# Patient Record
Sex: Female | Born: 1955 | Race: White | Hispanic: No | Marital: Married | Smoking: Never smoker
Health system: Southern US, Community
[De-identification: ages and names within clinical notes are randomized; demographics above are authoritative.]

## PROBLEM LIST (undated history)

## (undated) DIAGNOSIS — Z87442 Personal history of urinary calculi: Secondary | ICD-10-CM

## (undated) DIAGNOSIS — G44009 Cluster headache syndrome, unspecified, not intractable: Secondary | ICD-10-CM

## (undated) DIAGNOSIS — R112 Nausea with vomiting, unspecified: Secondary | ICD-10-CM

## (undated) DIAGNOSIS — IMO0001 Reserved for inherently not codable concepts without codable children: Secondary | ICD-10-CM

## (undated) DIAGNOSIS — K219 Gastro-esophageal reflux disease without esophagitis: Secondary | ICD-10-CM

## (undated) DIAGNOSIS — E119 Type 2 diabetes mellitus without complications: Secondary | ICD-10-CM

## (undated) DIAGNOSIS — S838X9A Sprain of other specified parts of unspecified knee, initial encounter: Secondary | ICD-10-CM

## (undated) DIAGNOSIS — I499 Cardiac arrhythmia, unspecified: Secondary | ICD-10-CM

## (undated) DIAGNOSIS — E785 Hyperlipidemia, unspecified: Secondary | ICD-10-CM

## (undated) DIAGNOSIS — J40 Bronchitis, not specified as acute or chronic: Secondary | ICD-10-CM

## (undated) DIAGNOSIS — M1710 Unilateral primary osteoarthritis, unspecified knee: Secondary | ICD-10-CM

## (undated) DIAGNOSIS — N200 Calculus of kidney: Secondary | ICD-10-CM

## (undated) DIAGNOSIS — M549 Dorsalgia, unspecified: Secondary | ICD-10-CM

## (undated) DIAGNOSIS — R002 Palpitations: Secondary | ICD-10-CM

## (undated) DIAGNOSIS — E039 Hypothyroidism, unspecified: Secondary | ICD-10-CM

## (undated) DIAGNOSIS — F419 Anxiety disorder, unspecified: Secondary | ICD-10-CM

## (undated) DIAGNOSIS — Z9889 Other specified postprocedural states: Secondary | ICD-10-CM

## (undated) DIAGNOSIS — Z8719 Personal history of other diseases of the digestive system: Secondary | ICD-10-CM

## (undated) DIAGNOSIS — Z9689 Presence of other specified functional implants: Secondary | ICD-10-CM

## (undated) DIAGNOSIS — R7303 Prediabetes: Secondary | ICD-10-CM

## (undated) DIAGNOSIS — M47817 Spondylosis without myelopathy or radiculopathy, lumbosacral region: Secondary | ICD-10-CM

## (undated) DIAGNOSIS — G8929 Other chronic pain: Secondary | ICD-10-CM

## (undated) HISTORY — PX: KIDNEY STONE SURGERY: SHX686

## (undated) HISTORY — PX: CYSTO: SHX6284

## (undated) HISTORY — PX: CHOLECYSTECTOMY: SHX55

---

## 1977-09-27 HISTORY — PX: TOTAL ABDOMINAL HYSTERECTOMY W/ BILATERAL SALPINGOOPHORECTOMY: SHX83

## 1988-09-27 HISTORY — PX: OTHER SURGICAL HISTORY: SHX169

## 2008-09-27 HISTORY — PX: SPINAL CORD STIMULATOR IMPLANT: SHX2422

## 2011-11-16 ENCOUNTER — Other Ambulatory Visit: Payer: Self-pay | Admitting: Orthopedic Surgery

## 2011-12-15 ENCOUNTER — Encounter (HOSPITAL_BASED_OUTPATIENT_CLINIC_OR_DEPARTMENT_OTHER): Payer: Self-pay | Admitting: *Deleted

## 2011-12-15 NOTE — Progress Notes (Signed)
NPO AFTER MN. ARRIVES AT 0745. NEEDS ISTAT AND EKG. WILL TAKE NORVASC, METOPROLOL, SYNTHROID, AND XANAX AM OF SURG. W/ SIP OF WATER.

## 2011-12-21 NOTE — H&P (Signed)
CC- [name] is a 56 y.o. female who presents with right knee pain.  HPI- . Knee Pain: Patient presents with knee pain involving the  right knee. Onset of the symptoms was several months ago. Inciting event: injured while walking dog. Current symptoms include giving out, pain located medially and swelling. Pain is aggravated by kneeling, rising after sitting and squatting.  Patient has had no prior knee problems. Evaluation to date: plain films: normal. Treatment to date: brace which is not very effective.  Past Medical History  Diagnosis Date  . Acute medial meniscal injury of knee right  . Borderline diabetes   . Hypertension   . Hypothyroidism   . GERD (gastroesophageal reflux disease)   . H/O hiatal hernia   . Facet joint disease of lumbosacral region   . Arthritis of knee bilateral   . Cluster headaches   . Spinal cord stimulator status placement in 2010  . Chronic back pain   . Rapid palpitations controlled w/ metoprolol  . Normal cardiac stress test 5 yrs ago  . Normal echocardiogram 5 yrs ago  . PONV (postoperative nausea and vomiting)     Past Surgical History  Procedure Date  . Spinal cord stimulator implant 2010  . Total abdominal hysterectomy w/ bilateral salpingoophorectomy 1979  . Benign cyst removed left breast 1990    Prior to Admission medications   Medication Sig Start Date End Date Taking? Authorizing Provider  ALPRAZolam Prudy Feeler) 0.5 MG tablet Take 0.5 mg by mouth 2 (two) times daily.   Yes Historical Provider, MD  amLODipine (NORVASC) 2.5 MG tablet Take 2.5 mg by mouth daily. Pt takes this med. For cluster headache's   Yes Historical Provider, MD  esomeprazole (NEXIUM) 40 MG capsule Take 40 mg by mouth daily before breakfast.   Yes Historical Provider, MD  glimepiride (AMARYL) 2 MG tablet Take 2 mg by mouth daily before breakfast.   Yes Historical Provider, MD  levothyroxine (SYNTHROID, LEVOTHROID) 50 MCG tablet Take 50 mcg by mouth every morning.   Yes  Historical Provider, MD  lidocaine (LIDODERM) 5 % Place 1 patch onto the skin daily. Remove & Discard patch within 12 hours or as directed by MD   Yes Historical Provider, MD  meloxicam (MOBIC) 15 MG tablet Take 15 mg by mouth daily.   Yes Historical Provider, MD  metFORMIN (GLUCOPHAGE) 1000 MG tablet Take 1,000 mg by mouth 2 (two) times daily with a meal.   Yes Historical Provider, MD  metoprolol (LOPRESSOR) 50 MG tablet Take 50 mg by mouth 2 (two) times daily. Pt takes this med. For palpitations/ tachycardia   Yes Historical Provider, MD  NORTRIPTYLINE HCL PO Take 100 mg by mouth at bedtime.   Yes Historical Provider, MD  simvastatin (ZOCOR) 40 MG tablet Take 40 mg by mouth every evening.   Yes Historical Provider, MD  traMADol (ULTRAM) 50 MG tablet Take 50 mg by mouth every 6 (six) hours as needed.   Yes Historical Provider, MD  Vitamin D, Ergocalciferol, (DRISDOL) 50000 UNITS CAPS Take 50,000 Units by mouth 2 (two) times a week.   Yes Historical Provider, MD   KNEE EXAM antalgic gait, soft tissue tenderness over medial joint line, reduced range of motion, collateral ligaments intact, normal ipsilateral hip exam  Physical Examination: General appearance - alert, well appearing, and in no distress Mental status - alert, oriented to person, place, and time Chest - clear to auscultation, no wheezes, rales or rhonchi, symmetric air entry Heart - normal rate,  regular rhythm, normal S1, S2, no murmurs, rubs, clicks or gallops Abdomen - soft, nontender, nondistended, no masses or organomegaly Neurological - alert, oriented, normal speech, no focal findings or movement disorder noted   Asessment/Plan---Right knee medial meniscal tear- - Plan right knee arthroscopy with meniscal debridement. Procedure risks and potential comps discussed with patient who elects to proceed. Goals are decreased pain and increased function with a high likelihood of achieving both

## 2011-12-22 ENCOUNTER — Ambulatory Visit (HOSPITAL_BASED_OUTPATIENT_CLINIC_OR_DEPARTMENT_OTHER): Payer: BC Managed Care – PPO | Admitting: Anesthesiology

## 2011-12-22 ENCOUNTER — Encounter (HOSPITAL_BASED_OUTPATIENT_CLINIC_OR_DEPARTMENT_OTHER)
Admission: RE | Disposition: A | Discharge status: Home / Self Care | Payer: Self-pay | Source: Ambulatory Visit | Attending: Orthopedic Surgery

## 2011-12-22 ENCOUNTER — Other Ambulatory Visit: Payer: Self-pay

## 2011-12-22 ENCOUNTER — Encounter (HOSPITAL_BASED_OUTPATIENT_CLINIC_OR_DEPARTMENT_OTHER): Payer: Self-pay | Admitting: *Deleted

## 2011-12-22 ENCOUNTER — Ambulatory Visit (HOSPITAL_BASED_OUTPATIENT_CLINIC_OR_DEPARTMENT_OTHER)
Admission: RE | Admit: 2011-12-22 | Discharge: 2011-12-22 | Disposition: A | Discharge status: Home / Self Care | Payer: BC Managed Care – PPO | Source: Ambulatory Visit | Attending: Orthopedic Surgery | Admitting: Orthopedic Surgery

## 2011-12-22 ENCOUNTER — Encounter (HOSPITAL_BASED_OUTPATIENT_CLINIC_OR_DEPARTMENT_OTHER): Payer: Self-pay | Admitting: Anesthesiology

## 2011-12-22 DIAGNOSIS — Y93K1 Activity, walking an animal: Secondary | ICD-10-CM | POA: Insufficient documentation

## 2011-12-22 DIAGNOSIS — S83249A Other tear of medial meniscus, current injury, unspecified knee, initial encounter: Secondary | ICD-10-CM | POA: Diagnosis present

## 2011-12-22 DIAGNOSIS — K219 Gastro-esophageal reflux disease without esophagitis: Secondary | ICD-10-CM | POA: Insufficient documentation

## 2011-12-22 DIAGNOSIS — IMO0002 Reserved for concepts with insufficient information to code with codable children: Secondary | ICD-10-CM | POA: Insufficient documentation

## 2011-12-22 DIAGNOSIS — E039 Hypothyroidism, unspecified: Secondary | ICD-10-CM | POA: Insufficient documentation

## 2011-12-22 DIAGNOSIS — I10 Essential (primary) hypertension: Secondary | ICD-10-CM | POA: Insufficient documentation

## 2011-12-22 DIAGNOSIS — Z79899 Other long term (current) drug therapy: Secondary | ICD-10-CM | POA: Insufficient documentation

## 2011-12-22 DIAGNOSIS — X58XXXA Exposure to other specified factors, initial encounter: Secondary | ICD-10-CM | POA: Insufficient documentation

## 2011-12-22 DIAGNOSIS — R7309 Other abnormal glucose: Secondary | ICD-10-CM | POA: Insufficient documentation

## 2011-12-22 HISTORY — DX: Palpitations: R00.2

## 2011-12-22 HISTORY — DX: Sprain of other specified parts of unspecified knee, initial encounter: S83.8X9A

## 2011-12-22 HISTORY — DX: Prediabetes: R73.03

## 2011-12-22 HISTORY — DX: Personal history of other diseases of the digestive system: Z87.19

## 2011-12-22 HISTORY — DX: Reserved for inherently not codable concepts without codable children: IMO0001

## 2011-12-22 HISTORY — DX: Nausea with vomiting, unspecified: Z98.890

## 2011-12-22 HISTORY — DX: Dorsalgia, unspecified: M54.9

## 2011-12-22 HISTORY — DX: Unilateral primary osteoarthritis, unspecified knee: M17.10

## 2011-12-22 HISTORY — PX: KNEE ARTHROSCOPY: SHX127

## 2011-12-22 HISTORY — DX: Other chronic pain: G89.29

## 2011-12-22 HISTORY — DX: Nausea with vomiting, unspecified: R11.2

## 2011-12-22 HISTORY — DX: Cluster headache syndrome, unspecified, not intractable: G44.009

## 2011-12-22 HISTORY — DX: Spondylosis without myelopathy or radiculopathy, lumbosacral region: M47.817

## 2011-12-22 HISTORY — DX: Gastro-esophageal reflux disease without esophagitis: K21.9

## 2011-12-22 HISTORY — DX: Hypothyroidism, unspecified: E03.9

## 2011-12-22 HISTORY — DX: Presence of other specified functional implants: Z96.89

## 2011-12-22 LAB — GLUCOSE, CAPILLARY: Glucose-Capillary: 136 mg/dL — ABNORMAL HIGH (ref 70–99)

## 2011-12-22 LAB — POCT I-STAT 4, (NA,K, GLUC, HGB,HCT)
Glucose, Bld: 126 mg/dL — ABNORMAL HIGH (ref 70–99)
HCT: 35 % — ABNORMAL LOW (ref 36.0–46.0)
Hemoglobin: 11.9 g/dL — ABNORMAL LOW (ref 12.0–15.0)
Potassium: 3.7 mEq/L (ref 3.5–5.1)

## 2011-12-22 SURGERY — ARTHROSCOPY, KNEE
Anesthesia: General | Site: Knee | Laterality: Right | Wound class: Clean

## 2011-12-22 MED ORDER — HYDROMORPHONE HCL 2 MG PO TABS
2.0000 mg | ORAL_TABLET | Freq: Four times a day (QID) | ORAL | Status: DC | PRN
  Administered 2011-12-22: 2 mg via ORAL

## 2011-12-22 MED ORDER — METHOCARBAMOL 500 MG PO TABS: 500.0000 mg | ORAL_TABLET | Freq: Four times a day (QID) | ORAL | Status: AC

## 2011-12-22 MED ORDER — DEXAMETHASONE SODIUM PHOSPHATE 10 MG/ML IJ SOLN: 10.0000 mg | Freq: Once | INTRAMUSCULAR | Status: DC

## 2011-12-22 MED ORDER — PROMETHAZINE HCL 25 MG/ML IJ SOLN: 6.2500 mg | INTRAMUSCULAR | Status: DC | PRN

## 2011-12-22 MED ORDER — SODIUM CHLORIDE 0.9 % IV SOLN: INTRAVENOUS | Status: DC

## 2011-12-22 MED ORDER — DEXAMETHASONE SODIUM PHOSPHATE 4 MG/ML IJ SOLN
INTRAMUSCULAR | Status: DC | PRN
  Administered 2011-12-22: 10 mg via INTRAVENOUS

## 2011-12-22 MED ORDER — FENTANYL CITRATE 0.05 MG/ML IJ SOLN
INTRAMUSCULAR | Status: DC | PRN
  Administered 2011-12-22 (×2): 50 ug via INTRAVENOUS

## 2011-12-22 MED ORDER — KETOROLAC TROMETHAMINE 30 MG/ML IJ SOLN
INTRAMUSCULAR | Status: DC | PRN
  Administered 2011-12-22: 30 mg via INTRAVENOUS

## 2011-12-22 MED ORDER — LACTATED RINGERS IV SOLN
INTRAVENOUS | Status: DC
  Administered 2011-12-22: 100 mL/h via INTRAVENOUS

## 2011-12-22 MED ORDER — FENTANYL CITRATE 0.05 MG/ML IJ SOLN
25.0000 ug | INTRAMUSCULAR | Status: DC | PRN
  Administered 2011-12-22: 25 ug via INTRAVENOUS

## 2011-12-22 MED ORDER — MIDAZOLAM HCL 5 MG/5ML IJ SOLN
INTRAMUSCULAR | Status: DC | PRN
  Administered 2011-12-22: 2 mg via INTRAVENOUS

## 2011-12-22 MED ORDER — PROPOFOL 10 MG/ML IV EMUL
INTRAVENOUS | Status: DC | PRN
  Administered 2011-12-22: 150 mg via INTRAVENOUS

## 2011-12-22 MED ORDER — HYDROMORPHONE HCL 2 MG PO TABS: 2.0000 mg | ORAL_TABLET | ORAL | Status: AC | PRN

## 2011-12-22 MED ORDER — ONDANSETRON HCL 4 MG PO TABS: 4.0000 mg | ORAL_TABLET | Freq: Three times a day (TID) | ORAL | Status: AC | PRN

## 2011-12-22 MED ORDER — ONDANSETRON HCL 4 MG/2ML IJ SOLN
INTRAMUSCULAR | Status: DC | PRN
  Administered 2011-12-22: 4 mg via INTRAVENOUS

## 2011-12-22 MED ORDER — SODIUM CHLORIDE 0.9 % IR SOLN
Status: DC | PRN
  Administered 2011-12-22: 9000 mL

## 2011-12-22 MED ORDER — BUPIVACAINE-EPINEPHRINE 0.25% -1:200000 IJ SOLN
INTRAMUSCULAR | Status: DC | PRN
  Administered 2011-12-22: 20 mL

## 2011-12-22 MED ORDER — METHOCARBAMOL 500 MG PO TABS
500.0000 mg | ORAL_TABLET | Freq: Three times a day (TID) | ORAL | Status: DC
  Administered 2011-12-22: 500 mg via ORAL

## 2011-12-22 MED ORDER — ACETAMINOPHEN 10 MG/ML IV SOLN
1000.0000 mg | Freq: Once | INTRAVENOUS | Status: AC
  Administered 2011-12-22: 1000 mg via INTRAVENOUS

## 2011-12-22 MED ORDER — CEFAZOLIN SODIUM-DEXTROSE 2-3 GM-% IV SOLR
2.0000 g | INTRAVENOUS | Status: AC
  Administered 2011-12-22: 2 g via INTRAVENOUS

## 2011-12-22 MED ORDER — CHLORHEXIDINE GLUCONATE 4 % EX LIQD: 60.0000 mL | Freq: Once | CUTANEOUS | Status: DC

## 2011-12-22 MED ORDER — LIDOCAINE HCL (CARDIAC) 20 MG/ML IV SOLN
INTRAVENOUS | Status: DC | PRN
  Administered 2011-12-22: 50 mg via INTRAVENOUS

## 2011-12-22 SURGICAL SUPPLY — 34 items
BANDAGE ELASTIC 6 VELCRO ST LF (GAUZE/BANDAGES/DRESSINGS) ×2 IMPLANT
BLADE 4.2CUDA (BLADE) ×2 IMPLANT
BLADE CUDA SHAVER 3.5 (BLADE) IMPLANT
BLADE CUTTER GATOR 3.5 (BLADE) IMPLANT
CANISTER SUCT LVC 12 LTR MEDI- (MISCELLANEOUS) ×2 IMPLANT
CANISTER SUCTION 2500CC (MISCELLANEOUS) IMPLANT
CLOTH BEACON ORANGE TIMEOUT ST (SAFETY) ×2 IMPLANT
DRAPE ARTHROSCOPY W/POUCH 114 (DRAPES) ×2 IMPLANT
DRSG EMULSION OIL 3X3 NADH (GAUZE/BANDAGES/DRESSINGS) ×2 IMPLANT
DURAPREP 26ML APPLICATOR (WOUND CARE) ×2 IMPLANT
ELECT MENISCUS 165MM 90D (ELECTRODE) IMPLANT
ELECT REM PT RETURN 9FT ADLT (ELECTROSURGICAL)
ELECTRODE REM PT RTRN 9FT ADLT (ELECTROSURGICAL) IMPLANT
GLOVE BIO SURGEON STRL SZ8 (GLOVE) ×2 IMPLANT
GLOVE ECLIPSE 6.5 STRL STRAW (GLOVE) ×2 IMPLANT
GLOVE INDICATOR 6.5 STRL GRN (GLOVE) ×2 IMPLANT
GLOVE INDICATOR 8.0 STRL GRN (GLOVE) ×2 IMPLANT
GOWN PREVENTION PLUS LG XLONG (DISPOSABLE) ×4 IMPLANT
IV NS IRRIG 3000ML ARTHROMATIC (IV SOLUTION) ×2 IMPLANT
KNEE WRAP E Z 3 GEL PACK (MISCELLANEOUS) ×2 IMPLANT
PACK ARTHROSCOPY DSU (CUSTOM PROCEDURE TRAY) ×2 IMPLANT
PACK BASIN DAY SURGERY FS (CUSTOM PROCEDURE TRAY) ×2 IMPLANT
PADDING CAST ABS 4INX4YD NS (CAST SUPPLIES) ×1
PADDING CAST ABS COTTON 4X4 ST (CAST SUPPLIES) ×1 IMPLANT
PADDING CAST COTTON 6X4 STRL (CAST SUPPLIES) ×2 IMPLANT
PENCIL BUTTON HOLSTER BLD 10FT (ELECTRODE) IMPLANT
SET ARTHROSCOPY TUBING (MISCELLANEOUS) ×1
SET ARTHROSCOPY TUBING LN (MISCELLANEOUS) ×1 IMPLANT
SPONGE GAUZE 4X4 12PLY (GAUZE/BANDAGES/DRESSINGS) ×2 IMPLANT
SUT ETHILON 4 0 PS 2 18 (SUTURE) ×2 IMPLANT
TOWEL OR 17X24 6PK STRL BLUE (TOWEL DISPOSABLE) ×2 IMPLANT
WAND 30 DEG SABER W/CORD (SURGICAL WAND) IMPLANT
WAND 90 DEG TURBOVAC W/CORD (SURGICAL WAND) ×2 IMPLANT
WATER STERILE IRR 500ML POUR (IV SOLUTION) ×2 IMPLANT

## 2011-12-22 NOTE — Anesthesia Postprocedure Evaluation (Signed)
  Anesthesia Post-op Note  Patient: [name]  Procedure(s) Performed: Procedure(s) (LRB): ARTHROSCOPY KNEE (Right)  Patient Location: PACU  Anesthesia Type: General  Level of Consciousness: awake and alert   Airway and Oxygen Therapy: Patient Spontanous Breathing  Post-op Pain: mild  Post-op Assessment: Post-op Vital signs reviewed, Patient's Cardiovascular Status Stable, Respiratory Function Stable, Patent Airway and No signs of Nausea or vomiting  Post-op Vital Signs: stable  Complications: No apparent anesthesia complications

## 2011-12-22 NOTE — Discharge Instructions (Signed)
Arthroscopic Procedure, Knee An arthroscopic procedure can find what is wrong with your knee. PROCEDURE Arthroscopy is a surgical technique that allows your orthopedic surgeon to diagnose and treat your knee injury with accuracy. They will look into your knee through a small instrument. This is almost like a small (pencil sized) telescope. Because arthroscopy affects your knee less than open knee surgery, you can anticipate a more rapid recovery. Taking an active role by following your caregiver's instructions will help with rapid and complete recovery. Use crutches, rest, elevation, ice, and knee exercises as instructed. The length of recovery depends on various factors including type of injury, age, physical condition, medical conditions, and your rehabilitation. Your knee is the joint between the large bones (femur and tibia) in your leg. Cartilage covers these bone ends which are smooth and slippery and allow your knee to bend and move smoothly. Two menisci, thick, semi-lunar shaped pads of cartilage which form a rim inside the joint, help absorb shock and stabilize your knee. Ligaments bind the bones together and support your knee joint. Muscles move the joint, help support your knee, and take stress off the joint itself. Because of this all programs and physical therapy to rehabilitate an injured or repaired knee require rebuilding and strengthening your muscles. AFTER THE PROCEDURE  After the procedure, you will be moved to a recovery area until most of the effects of the medication have worn off. Your caregiver will discuss the test results with you.   Only take over-the-counter or prescription medicines for pain, discomfort, or fever as directed by your caregiver.  SEEK MEDICAL CARE IF:   You have increased bleeding from your wounds.   You see redness, swelling, or have increasing pain in your wounds.   You have pus coming from your wound.   You have an oral temperature above 102 F (38.9  C).   You notice a bad smell coming from the wound or dressing.   You have severe pain with any motion of your knee.  SEEK IMMEDIATE MEDICAL CARE IF:   You develop a rash.   You have difficulty breathing.   You have any allergic problems.  Document Released: 09/10/2000 Document Revised: 09/02/2011 Document Reviewed: 04/03/2008 [employer] Patient Information 2012 [place], [facility]  General Anesthetic, Adult A doctor specialized in giving anesthesia (anesthesiologist) or a nurse specialized in giving anesthesia (nurse anesthetist) gives medicine that makes you sleep while a procedure is performed (general anesthetic). Once the general anesthetic has been administered, you will be in a sleeplike state in which you feel no pain. After having a general anestheticyou may feel:   Dizzy.   Weak.   Drowsy.   Confused.  These feelings are normal and can be expected to last for up to 24 hours after the procedure is completed.  LET YOUR CAREGIVER KNOW ABOUT:  Allergies you have.   Medications you are taking, including herbs, eye drops, over the counter medications, dietary supplements, and creams.   Previous problems you have had with anesthetics or numbing medicines.   Use of cigarettes, alcohol, or illicit drugs.   Possibility of pregnancy, if this applies.   History of bleeding or blood disorders, including blood clots and clotting disorders.   Previous surgeries you have had and types of anesthetics you have received.   Family medical history, especially anesthetic problems.   Other health problems.  BEFORE THE PROCEDURE  You may brush your teeth on the morning of surgery but you should have no solid food or  non-clear liquids for a minimum of 8 hours prior to your procedure. Clear liquids (water, black coffee, and tea) are acceptable in small amounts until 2 hours prior to your procedure.   You may take your regular medications the morning of your procedure unless your  caregiver indicates otherwise.  AFTER THE PROCEDURE  After surgery, you will be taken to the recovery area where a nurse will monitor your progress. You will be allowed to go home when you are awake, stable, taking fluids well, and without serious pain or complications.   For the first 24 hours following an anesthetic:   Have a responsible person with you.   Do not drive a car. If you are alone, do not take public transportation.   Do not engage in strenuous activity. You may usually resume normal activities the next day, or as advised by your caregiver.   Do not drink alcohol.   Do not take medicine that has not been prescribed by your caregiver.   Do not sign important papers or make important decisions as your judgement may be impaired.   You may resume a normal diet as directed.   Change bandages (dressings) as directed.   Only take over-the-counter or prescription medicines for pain, discomfort, or fever as directed by your caregiver.  If you have questions or problems that seem related to the anesthetic, call the hospital and ask for the anesthetist, anesthesiologist, or anesthesia department. SEEK IMMEDIATE MEDICAL CARE IF:   You develop a rash.   You have difficulty breathing.   You have chest pain.   You have allergic problems.   You have uncontrolled nausea.   You have uncontrolled vomiting.   You develop any serious bleeding, especially from the incision site.  Document Released: 12/21/2007 Document Revised: 09/02/2011 Document Reviewed: 01/14/2011 [facility] Patient Information 2012 [place], [place].Marland Kitchen

## 2011-12-22 NOTE — Anesthesia Procedure Notes (Signed)
Procedure Name: LMA Insertion Performed by: Jessi Pitstick T Pre-anesthesia Checklist: Patient identified, Emergency Drugs available, Suction available and Patient being monitored Patient Re-evaluated:Patient Re-evaluated prior to inductionOxygen Delivery Method: Circle System Utilized Preoxygenation: Pre-oxygenation with 100% oxygen Intubation Type: IV induction Ventilation: Mask ventilation without difficulty LMA: LMA inserted LMA Size: 4.0 Number of attempts: 1 Airway Equipment and Method: bite block Placement Confirmation: positive ETCO2 Dental Injury: Teeth and Oropharynx as per pre-operative assessment      

## 2011-12-22 NOTE — Transfer of Care (Signed)
Immediate Anesthesia Transfer of Care Note  Patient: [name]  Procedure(s) Performed: Procedure(s) (LRB): ARTHROSCOPY KNEE (Right)  Patient Location: PACU  Anesthesia Type: General  Level of Consciousness: awake and alert   Airway & Oxygen Therapy: Patient Spontanous Breathing and Patient connected to nasal cannula oxygen  Post-op Assessment: Report given to PACU RN and Post -op Vital signs reviewed and stable  Post vital signs: Reviewed and stable  Complications: No apparent anesthesia complications

## 2011-12-22 NOTE — Anesthesia Preprocedure Evaluation (Signed)
Anesthesia Evaluation  Patient identified by MRN, date of birth, ID band Patient awake    Reviewed: Allergy & Precautions, H&P , NPO status , Patient's Chart, lab work & pertinent test results  History of Anesthesia Complications (+) PONV  Airway Mallampati: II TM Distance: >3 FB Neck ROM: Full    Dental No notable dental hx.    Pulmonary neg pulmonary ROS,  breath sounds clear to auscultation  Pulmonary exam normal       Cardiovascular hypertension, Pt. on medications and Pt. on home beta blockers Rhythm:Regular Rate:Normal     Neuro/Psych  Headaches, negative psych ROS   GI/Hepatic Neg liver ROS, hiatal hernia, GERD-  Medicated,  Endo/Other  Diabetes mellitus-, Type 2, Oral Hypoglycemic AgentsHypothyroidism   Renal/GU negative Renal ROS  negative genitourinary   Musculoskeletal negative musculoskeletal ROS (+)   Abdominal   Peds negative pediatric ROS (+)  Hematology negative hematology ROS (+)   Anesthesia Other Findings   Reproductive/Obstetrics negative OB ROS                           Anesthesia Physical Anesthesia Plan  ASA: II  Anesthesia Plan: General   Post-op Pain Management:    Induction: Intravenous  Airway Management Planned: LMA  Additional Equipment:   Intra-op Plan:   Post-operative Plan: Extubation in OR  Informed Consent: I have reviewed the patients History and Physical, chart, labs and discussed the procedure including the risks, benefits and alternatives for the proposed anesthesia with the patient or authorized representative who has indicated his/her understanding and acceptance.   Dental advisory given  Plan Discussed with: CRNA  Anesthesia Plan Comments:         Anesthesia Quick Evaluation

## 2011-12-22 NOTE — Op Note (Signed)
Preoperative diagnosis-  Right knee medial meniscal tear  Postoperative diagnosis Right- knee medial meniscal tear   Plus Right medial chondral defect  Procedure- Right knee arthroscopy with medial  Meniscal debridement and chondroplasty   Surgeon- Gus Rankin. Sashay Felling, MD  Anesthesia-General  EBL-  minimal Complications- None  Condition- PACU - hemodynamically stable.  Brief clinical note- -[name] is a 56 y.o.  female with a several month history of right knee pain and mechanical symptoms. Exam and history suggested medial meniscal tear but she could not have an MRI due to a spinal cord stimulator. The patient presents now for arthroscopy and debridement   Procedure in detail -       After successful administration of General anesthetic, a tourmiquet is placed high on the Right  thigh and the Right lower extremity is prepped and draped in the usual sterile fashion. Time out is performed by the surgical team. Standard superomedial and inferolateral portal sites are marked and incisions made with an 11 blade. The inflow cannula is passed through the superomedial portal and camera through the inferolateral portal and inflow is initiated. Arthroscopic visualization proceeds.      The undersurface of the patella and trochlea are visualized and there is grade II chondromalacia of the patella without any unstable cartilage lesions. The medial and lateral gutters are visualized and there are  no loose bodies. Flexion and valgus force is applied to the knee and the medial compartment is entered. A spinal needle is passed into the joint through the site marked for the inferomedial portal. A small incision is made and the dilator passed into the joint. The findings for the medial compartment are 1 x 1 cm chondral defect medial femoral condyle and medial meniscal tear . The tear is debrided to a stable base with baskets and a shaver and sealed off with the Arthrocare. The shaver is used to debride  the unstable cartilage to a stable cartilaginous base with stable edges. It is probed and found to be stable.    The intercondylar notch is visualized and the ACL appears normal. The lateral compartment is entered and the findings are normal .      The joint is again inspected and there are no other tears, defects or loose bodies identified. The arthroscopic equipment is then removed from the inferior portals which are closed with interrupted 4-0 nylon. 20 ml of .25% Marcaine with epinephrine are injected through the inflow cannula and the cannula is then removed and the portal closed with nylon. The incisions are cleaned and dried and a bulky sterile dressing is applied. The patient is then awakened and transported to recovery in stable condition.   12/22/2011, 9:39 AM

## 2011-12-22 NOTE — Interval H&P Note (Signed)
History and Physical Interval Note:  12/22/2011 8:59 AM  [name]  has presented today for surgery, with the diagnosis of RIGHT KNEE MEDIAL MENISCAL TEAR  The various methods of treatment have been discussed with the patient and family. After consideration of risks, benefits and other options for treatment, the patient has consented to  Procedure(s) (LRB): ARTHROSCOPY KNEE (Right) as a surgical intervention .  The patients' history has been reviewed, patient examined, no change in status, stable for surgery.  I have reviewed the patients' chart and labs.  Questions were answered to the patient's satisfaction.     [name]

## 2011-12-23 ENCOUNTER — Encounter (HOSPITAL_BASED_OUTPATIENT_CLINIC_OR_DEPARTMENT_OTHER): Payer: Self-pay | Admitting: Orthopedic Surgery

## 2011-12-28 ENCOUNTER — Encounter (HOSPITAL_BASED_OUTPATIENT_CLINIC_OR_DEPARTMENT_OTHER): Payer: Self-pay

## 2012-08-22 ENCOUNTER — Other Ambulatory Visit: Payer: Self-pay | Admitting: Orthopedic Surgery

## 2012-08-22 MED ORDER — DEXAMETHASONE SODIUM PHOSPHATE 10 MG/ML IJ SOLN: 10.0000 mg | Freq: Once | INTRAMUSCULAR | Status: DC

## 2012-08-22 NOTE — Progress Notes (Signed)
Preoperative surgical orders have been place into the Epic hospital system for [name] on 08/22/2012, 5:13 PM  by Patrica Duel for surgery on 09/06/12.  Preop Knee Scope orders including IV Tylenol and IV Decadron as long as there are no contraindications to the above medications. Avel Peace, PA-C

## 2012-08-29 ENCOUNTER — Encounter (HOSPITAL_COMMUNITY): Payer: Self-pay | Admitting: Pharmacy Technician

## 2012-09-04 ENCOUNTER — Encounter (HOSPITAL_COMMUNITY): Payer: Self-pay

## 2012-09-04 ENCOUNTER — Encounter (HOSPITAL_COMMUNITY)
Admission: RE | Admit: 2012-09-04 | Discharge: 2012-09-04 | Disposition: A | Discharge status: Home / Self Care | Payer: BC Managed Care – PPO | Source: Ambulatory Visit | Attending: Orthopedic Surgery | Admitting: Orthopedic Surgery

## 2012-09-04 HISTORY — DX: Type 2 diabetes mellitus without complications: E11.9

## 2012-09-04 HISTORY — DX: Cardiac arrhythmia, unspecified: I49.9

## 2012-09-04 HISTORY — DX: Hyperlipidemia, unspecified: E78.5

## 2012-09-04 LAB — BASIC METABOLIC PANEL
Calcium: 9.3 mg/dL (ref 8.4–10.5)
GFR calc non Af Amer: 71 mL/min — ABNORMAL LOW (ref >90)
Glucose, Bld: 97 mg/dL (ref 70–99)
Sodium: 141 mEq/L (ref 135–145)

## 2012-09-04 LAB — CBC
MCH: 30.8 pg (ref 26.0–34.0)
MCHC: 33.1 g/dL (ref 30.0–36.0)
Platelets: 260 10*3/uL (ref 150–400)
RDW: 12.9 % (ref 11.5–15.5)

## 2012-09-04 LAB — SURGICAL PCR SCREEN: MRSA, PCR: NEGATIVE

## 2012-09-04 NOTE — Patient Instructions (Addendum)
[name]  09/04/2012                           YOUR PROCEDURE IS SCHEDULED ON:  09/07/11               PLEASE REPORT TO SHORT STAY CENTER AT :  12:30 PM               CALL THIS NUMBER IF ANY PROBLEMS THE DAY OF SURGERY :                [phone]                      REMEMBER:   Do not eat food or drink liquids AFTER MIDNIGHT  May have clear liquids UNTIL 6 HOURS BEFORE SURGERY (9:00 AM)  Clear liquids include soda, tea, black coffee, apple or grape juice, broth.  Take these medicines the morning of surgery with A SIP OF WATER:  ALPRAZOLAM / AMLODIPINE / LEVOTHYROXINE / METOPROLOL / FLOMAX / HYDROCODONE IF NEEDED  OR TRAMADOL IF NEEDED   Do not wear jewelry, make-up   Do not wear lotions, powders, or perfumes.   Do not shave legs or underarms 12 hrs. before surgery (men may shave face)  Do not bring valuables to the hospital.  Contacts, dentures or bridgework may not be worn into surgery.  Leave suitcase in the car. After surgery it may be brought to your room.  For patients admitted to the hospital more than one night, checkout time is 11:00                          The day of discharge.   Patients discharged the day of surgery will not be allowed to drive home                             If going home same day of surgery, must have someone stay with you first                           24 hrs at home and arrange for some one to drive you home from hospital.    Special Instructions:   Please read over the following fact sheets that you were given:               1. MRSA  INFORMATION                      2. [place] PREPARING FOR SURGERY SHEET               3. INCENTIVE SPIROMETER                                                X_____________________________________________________________________                       FAILURE TO FOLLOW THESE INSTRUCTIONS MAY RESULT IN CANCELLATION OF SURGERY

## 2012-09-05 NOTE — H&P (Signed)
CC- [name] is a 56 y.o. female who presents with right knee pain.  HPI- . Knee Pain: Patient presents with knee pain involving the  right knee. Onset of the symptoms was several months ago. Inciting event: none known. Current symptoms include giving out, pain located medially, stiffness and swelling. Pain is aggravated by any weight bearing, inactivity, pivoting, rising after sitting, squatting, standing and walking.  Patient has had prior knee problems. Evaluation to date: arthroscopy previous arthroscopy with meniscal tear. Treatment to date: brace which is ineffective, corticosteroid injection which was ineffective, PT which was ineffective, rest and viscosupplement injections which were ineefective. She can not have an MRI because of a spinal cord stimulator and exam and history suggest possible recurrent meniscal tear. She presents for repeat arthroscopy as all other treatment modalities have failed and she has intractable pain.  Past Medical History  Diagnosis Date  . Acute medial meniscal injury of knee right  . Borderline diabetes   . Hypothyroidism   . GERD (gastroesophageal reflux disease)   . H/O hiatal hernia   . Facet joint disease of lumbosacral region   . Arthritis of knee bilateral   . Spinal cord stimulator status placement in 2010  . Chronic back pain   . Rapid palpitations controlled w/ metoprolol  . Normal cardiac stress test 5 yrs ago  . Normal echocardiogram 5 yrs ago  . PONV (postoperative nausea and vomiting)   . Asthma     VERY INFREQUENT PROBLEM  . Hyperlipidemia   . Dysrhythmia     PALPITATIONS CONTROLLED WITH METOPROLOL  . Cluster headaches     TAKES NORVASC FOR HEADACHES  . Diabetes mellitus without complication     Past Surgical History  Procedure Date  . Spinal cord stimulator implant 2010  . Total abdominal hysterectomy w/ bilateral salpingoophorectomy 1979  . Benign cyst removed left breast 1990  . Knee arthroscopy 12/22/2011    Procedure:  ARTHROSCOPY KNEE;  Surgeon: [name], MD;  Location: [facility];  Service: Orthopedics;  Laterality: Right;  meniscal  DEBRIDEMENT and chondroplasty  . Cysto     X2 FOR KIDNEY STONE     Prior to Admission medications   Medication Sig Start Date End Date Taking? Authorizing Provider  ALPRAZolam Prudy Feeler) 0.5 MG tablet Take 0.5 mg by mouth 2 (two) times daily.    Historical Provider, MD  amLODipine (NORVASC) 2.5 MG tablet Take 2.5 mg by mouth daily before breakfast. Pt takes this med. For cluster headache's    Historical Provider, MD  esomeprazole (NEXIUM) 40 MG capsule Take 40 mg by mouth daily before supper.     Historical Provider, MD  glimepiride (AMARYL) 2 MG tablet Take 2 mg by mouth daily before breakfast.    Historical Provider, MD  HYDROcodone-acetaminophen (NORCO/VICODIN) 5-325 MG per tablet Take 1 tablet by mouth every 8 (eight) hours as needed.    Historical Provider, MD  levothyroxine (SYNTHROID, LEVOTHROID) 50 MCG tablet Take 50 mcg by mouth every morning.    Historical Provider, MD  lidocaine (LIDODERM) 5 % Place 1 patch onto the skin daily. Remove & Discard patch within 12 hours or as directed by MD    Historical Provider, MD  meloxicam (MOBIC) 15 MG tablet Take 15 mg by mouth daily.    Historical Provider, MD  metFORMIN (GLUCOPHAGE) 1000 MG tablet Take 1,000 mg by mouth 2 (two) times daily with a meal.    Historical Provider, MD  metoprolol (LOPRESSOR) 50 MG tablet Take  50 mg by mouth 2 (two) times daily. Pt takes this med. For palpitations/ tachycardia    Historical Provider, MD  nortriptyline (PAMELOR) 50 MG capsule Take 100 mg by mouth at bedtime.    Historical Provider, MD  simvastatin (ZOCOR) 40 MG tablet Take 20 mg by mouth every evening. Takes 1/2 tablet    Historical Provider, MD  Tamsulosin HCl (FLOMAX) 0.4 MG CAPS Take 0.4 mg by mouth every morning.    Historical Provider, MD  traMADol (ULTRAM) 50 MG tablet Take 50 mg by mouth every 6 (six) hours as  needed. Pain    Historical Provider, MD  Vitamin D, Ergocalciferol, (DRISDOL) 50000 UNITS CAPS Take 50,000 Units by mouth 2 (two) times a week.    Historical Provider, MD   KNEE EXAM antalgic gait, soft tissue tenderness over medial joint line, collateral ligaments intact, negative Lachman sign  Physical Examination: General appearance - alert, well appearing, and in no distress Mental status - alert, oriented to person, place, and time Chest - clear to auscultation, no wheezes, rales or rhonchi, symmetric air entry Heart - normal rate, regular rhythm, normal S1, S2, no murmurs, rubs, clicks or gallops Abdomen - soft, nontender, nondistended, no masses or organomegaly Neurological - alert, oriented, normal speech, no focal findings or movement disorder noted   Asessment/Plan--- Rightt knee medial meniscal tear- - Plan rightt knee arthroscopy with meniscal debridement. Procedure risks and potential comps discussed with patient who elects to proceed. Goals are decreased pain and increased function with a high likelihood of achieving both

## 2012-09-06 ENCOUNTER — Encounter (HOSPITAL_COMMUNITY): Payer: Self-pay | Admitting: Anesthesiology

## 2012-09-06 ENCOUNTER — Encounter (HOSPITAL_COMMUNITY): Payer: Self-pay | Admitting: *Deleted

## 2012-09-06 ENCOUNTER — Encounter (HOSPITAL_COMMUNITY)
Admission: RE | Disposition: A | Discharge status: Home / Self Care | Payer: Self-pay | Source: Ambulatory Visit | Attending: Orthopedic Surgery

## 2012-09-06 ENCOUNTER — Ambulatory Visit (HOSPITAL_COMMUNITY)
Admission: RE | Admit: 2012-09-06 | Discharge: 2012-09-06 | Disposition: A | Discharge status: Home / Self Care | Payer: BC Managed Care – PPO | Source: Ambulatory Visit | Attending: Orthopedic Surgery | Admitting: Orthopedic Surgery

## 2012-09-06 ENCOUNTER — Ambulatory Visit (HOSPITAL_COMMUNITY): Payer: BC Managed Care – PPO | Admitting: Anesthesiology

## 2012-09-06 DIAGNOSIS — Z01812 Encounter for preprocedural laboratory examination: Secondary | ICD-10-CM | POA: Insufficient documentation

## 2012-09-06 DIAGNOSIS — E119 Type 2 diabetes mellitus without complications: Secondary | ICD-10-CM | POA: Insufficient documentation

## 2012-09-06 DIAGNOSIS — E039 Hypothyroidism, unspecified: Secondary | ICD-10-CM | POA: Insufficient documentation

## 2012-09-06 DIAGNOSIS — Z79899 Other long term (current) drug therapy: Secondary | ICD-10-CM | POA: Insufficient documentation

## 2012-09-06 DIAGNOSIS — K219 Gastro-esophageal reflux disease without esophagitis: Secondary | ICD-10-CM | POA: Insufficient documentation

## 2012-09-06 DIAGNOSIS — E785 Hyperlipidemia, unspecified: Secondary | ICD-10-CM | POA: Insufficient documentation

## 2012-09-06 DIAGNOSIS — I10 Essential (primary) hypertension: Secondary | ICD-10-CM | POA: Insufficient documentation

## 2012-09-06 DIAGNOSIS — S83249A Other tear of medial meniscus, current injury, unspecified knee, initial encounter: Secondary | ICD-10-CM

## 2012-09-06 DIAGNOSIS — J45909 Unspecified asthma, uncomplicated: Secondary | ICD-10-CM | POA: Insufficient documentation

## 2012-09-06 DIAGNOSIS — M2240 Chondromalacia patellae, unspecified knee: Secondary | ICD-10-CM | POA: Insufficient documentation

## 2012-09-06 HISTORY — PX: KNEE ARTHROSCOPY: SHX127

## 2012-09-06 LAB — GLUCOSE, CAPILLARY: Glucose-Capillary: 91 mg/dL (ref 70–99)

## 2012-09-06 SURGERY — ARTHROSCOPY, KNEE
Anesthesia: General | Site: Knee | Laterality: Right | Wound class: Clean

## 2012-09-06 MED ORDER — LACTATED RINGERS IV SOLN
INTRAVENOUS | Status: DC | PRN
  Administered 2012-09-06: 15:00:00 via INTRAVENOUS

## 2012-09-06 MED ORDER — ACETAMINOPHEN 10 MG/ML IV SOLN
1000.0000 mg | Freq: Once | INTRAVENOUS | Status: AC
  Administered 2012-09-06: 1000 mg via INTRAVENOUS

## 2012-09-06 MED ORDER — MEPERIDINE HCL 50 MG/ML IJ SOLN: 6.2500 mg | INTRAMUSCULAR | Status: DC | PRN

## 2012-09-06 MED ORDER — OXYCODONE HCL 5 MG PO TABS
5.0000 mg | ORAL_TABLET | Freq: Once | ORAL | Status: AC | PRN
  Administered 2012-09-06: 5 mg via ORAL
  Filled 2012-09-06: qty 1

## 2012-09-06 MED ORDER — LACTATED RINGERS IR SOLN
Status: DC | PRN
  Administered 2012-09-06: 3000 mL

## 2012-09-06 MED ORDER — ACETAMINOPHEN 10 MG/ML IV SOLN: 1000.0000 mg | Freq: Once | INTRAVENOUS | Status: DC | PRN

## 2012-09-06 MED ORDER — PROMETHAZINE HCL 25 MG/ML IJ SOLN: 6.2500 mg | INTRAMUSCULAR | Status: DC | PRN

## 2012-09-06 MED ORDER — KETOROLAC TROMETHAMINE 60 MG/2ML IM SOLN
INTRAMUSCULAR | Status: DC | PRN
  Administered 2012-09-06: 30 mg via INTRAMUSCULAR

## 2012-09-06 MED ORDER — MIDAZOLAM HCL 5 MG/5ML IJ SOLN
INTRAMUSCULAR | Status: DC | PRN
  Administered 2012-09-06: 1 mg via INTRAVENOUS
  Administered 2012-09-06: .5 mg via INTRAVENOUS

## 2012-09-06 MED ORDER — LACTATED RINGERS IV SOLN
INTRAVENOUS | Status: DC
  Administered 2012-09-06: 18:00:00 via INTRAVENOUS

## 2012-09-06 MED ORDER — HYDROMORPHONE HCL PF 1 MG/ML IJ SOLN
0.2500 mg | INTRAMUSCULAR | Status: DC | PRN
  Administered 2012-09-06 (×4): 0.5 mg via INTRAVENOUS

## 2012-09-06 MED ORDER — BUPIVACAINE-EPINEPHRINE PF 0.25-1:200000 % IJ SOLN
INTRAMUSCULAR | Status: AC
  Filled 2012-09-06: qty 30

## 2012-09-06 MED ORDER — VANCOMYCIN HCL IN DEXTROSE 1-5 GM/200ML-% IV SOLN
1000.0000 mg | INTRAVENOUS | Status: AC
  Administered 2012-09-06: 1000 mg via INTRAVENOUS

## 2012-09-06 MED ORDER — DEXAMETHASONE SODIUM PHOSPHATE 10 MG/ML IJ SOLN
INTRAMUSCULAR | Status: DC | PRN
  Administered 2012-09-06: 10 mg via INTRAVENOUS

## 2012-09-06 MED ORDER — HYDROMORPHONE HCL 2 MG PO TABS: 2.0000 mg | ORAL_TABLET | ORAL | Status: DC | PRN

## 2012-09-06 MED ORDER — ONDANSETRON HCL 4 MG/2ML IJ SOLN
INTRAMUSCULAR | Status: DC | PRN
  Administered 2012-09-06 (×2): 2 mg via INTRAVENOUS

## 2012-09-06 MED ORDER — HYDROMORPHONE HCL PF 1 MG/ML IJ SOLN
INTRAMUSCULAR | Status: AC
  Filled 2012-09-06: qty 1

## 2012-09-06 MED ORDER — METHOCARBAMOL 500 MG PO TABS: 500.0000 mg | ORAL_TABLET | Freq: Four times a day (QID) | ORAL | Status: DC

## 2012-09-06 MED ORDER — OXYCODONE HCL 5 MG/5ML PO SOLN
5.0000 mg | Freq: Once | ORAL | Status: AC | PRN
  Filled 2012-09-06: qty 5

## 2012-09-06 MED ORDER — FENTANYL CITRATE 0.05 MG/ML IJ SOLN
INTRAMUSCULAR | Status: DC | PRN
  Administered 2012-09-06 (×3): 50 ug via INTRAVENOUS

## 2012-09-06 MED ORDER — PROPOFOL 10 MG/ML IV EMUL
INTRAVENOUS | Status: DC | PRN
  Administered 2012-09-06: 180 mg via INTRAVENOUS

## 2012-09-06 MED ORDER — CHLORHEXIDINE GLUCONATE 4 % EX LIQD
60.0000 mL | Freq: Once | CUTANEOUS | Status: DC
  Filled 2012-09-06: qty 60

## 2012-09-06 MED ORDER — LIDOCAINE HCL (CARDIAC) 20 MG/ML IV SOLN
INTRAVENOUS | Status: DC | PRN
  Administered 2012-09-06: 75 mg via INTRAVENOUS

## 2012-09-06 MED ORDER — HYDROMORPHONE HCL PF 1 MG/ML IJ SOLN
INTRAMUSCULAR | Status: DC
Start: 2012-09-06 — End: 2012-09-06
  Filled 2012-09-06: qty 1

## 2012-09-06 MED ORDER — BUPIVACAINE-EPINEPHRINE 0.25% -1:200000 IJ SOLN
INTRAMUSCULAR | Status: DC | PRN
  Administered 2012-09-06: 30 mL

## 2012-09-06 MED ORDER — SODIUM CHLORIDE 0.9 % IV SOLN: INTRAVENOUS | Status: DC

## 2012-09-06 MED ORDER — VANCOMYCIN HCL IN DEXTROSE 1-5 GM/200ML-% IV SOLN
INTRAVENOUS | Status: AC
  Filled 2012-09-06: qty 200

## 2012-09-06 MED ORDER — DEXTROSE 5 % IV SOLN
INTRAVENOUS | Status: DC | PRN
  Administered 2012-09-06: 15:00:00 via INTRAVENOUS

## 2012-09-06 MED ORDER — ACETAMINOPHEN 10 MG/ML IV SOLN
INTRAVENOUS | Status: AC
  Filled 2012-09-06: qty 100

## 2012-09-06 SURGICAL SUPPLY — 27 items
BANDAGE ELASTIC 6 VELCRO ST LF (GAUZE/BANDAGES/DRESSINGS) ×2 IMPLANT
BLADE 4.2CUDA (BLADE) ×2 IMPLANT
CLOTH BEACON ORANGE TIMEOUT ST (SAFETY) ×2 IMPLANT
CUFF TOURN SGL QUICK 34 (TOURNIQUET CUFF) ×1
CUFF TRNQT CYL 34X4X40X1 (TOURNIQUET CUFF) ×1 IMPLANT
DRAPE U-SHAPE 47X51 STRL (DRAPES) ×2 IMPLANT
DRSG EMULSION OIL 3X3 NADH (GAUZE/BANDAGES/DRESSINGS) ×2 IMPLANT
DRSG PAD ABDOMINAL 8X10 ST (GAUZE/BANDAGES/DRESSINGS) ×2 IMPLANT
DURAPREP 26ML APPLICATOR (WOUND CARE) ×2 IMPLANT
GLOVE BIO SURGEON STRL SZ7.5 (GLOVE) ×2 IMPLANT
GLOVE BIO SURGEON STRL SZ8 (GLOVE) ×2 IMPLANT
GLOVE BIOGEL PI IND STRL 8 (GLOVE) ×1 IMPLANT
GLOVE BIOGEL PI INDICATOR 8 (GLOVE) ×1
GOWN STRL NON-REIN LRG LVL3 (GOWN DISPOSABLE) ×2 IMPLANT
MANIFOLD NEPTUNE II (INSTRUMENTS) ×4 IMPLANT
PACK ARTHROSCOPY WL (CUSTOM PROCEDURE TRAY) ×2 IMPLANT
PACK ICE MAXI GEL EZY WRAP (MISCELLANEOUS) ×6 IMPLANT
PADDING CAST COTTON 6X4 STRL (CAST SUPPLIES) ×2 IMPLANT
PADDING CAST SYN 6 (CAST SUPPLIES) ×1
PADDING CAST SYNTHETIC 6X4 NS (CAST SUPPLIES) ×1 IMPLANT
POSITIONER SURGICAL ARM (MISCELLANEOUS) ×2 IMPLANT
SET ARTHROSCOPY TUBING (MISCELLANEOUS) ×1
SET ARTHROSCOPY TUBING LN (MISCELLANEOUS) ×1 IMPLANT
SUT ETHILON 4 0 PS 2 18 (SUTURE) ×2 IMPLANT
TOWEL OR 17X26 10 PK STRL BLUE (TOWEL DISPOSABLE) ×2 IMPLANT
WAND 90 DEG TURBOVAC W/CORD (SURGICAL WAND) ×2 IMPLANT
WRAP KNEE MAXI GEL POST OP (GAUZE/BANDAGES/DRESSINGS) ×4 IMPLANT

## 2012-09-06 NOTE — Transfer of Care (Signed)
Immediate Anesthesia Transfer of Care Note  Patient: [name]  Procedure(s) Performed: Procedure(s) (LRB) with comments: ARTHROSCOPY KNEE (Right) - WITH DEBRIDEMENT  Patient Location: PACU  Anesthesia Type:General  Level of Consciousness: awake, alert , oriented and patient cooperative  Airway & Oxygen Therapy: Patient Spontanous Breathing and Patient connected to face mask oxygen  Post-op Assessment: Report given to PACU RN, Post -op Vital signs reviewed and stable and Patient moving all extremities  Post vital signs: Reviewed and stable  Complications: No apparent anesthesia complications

## 2012-09-06 NOTE — Op Note (Signed)
Preoperative diagnosis-  Right knee medial femoral chondral defect  Postoperative diagnosis Right- knee  medial femoral chondral defect   Procedure- Right knee arthroscopy with medial femoral chondroplasty    Surgeon- Gus Rankin. Amear Strojny, MD  Anesthesia-General  EBL-  minimal Complications- None  Condition- PACU - hemodynamically stable.  Brief clinical note- -[name] is a 56 y.o.  female with a several month history of right knee pain and mechanical symptoms without any improvement with injections of cortisone or viscosupplements. Exam and history suggested medial meniscal tear vs chondral defect but she could not have an MRI due to a spinal cord stimulator. The patient presents now for arthroscopy and debridement given failure of non-operative management   Procedure in detail -       After successful administration of General anesthetic, a tourmiquet is placed high on the Right  thigh and the Right lower extremity is prepped and draped in the usual sterile fashion. Time out is performed by the surgical team. Standard superomedial and inferolateral portal sites are marked and incisions made with an 11 blade. The inflow cannula is passed through the superomedial portal and camera through the inferolateral portal and inflow is initiated. Arthroscopic visualization proceeds.      The undersurface of the patella and trochlea are visualized and there is Grade II chondromalacia without any unstable lesions. The medial and lateral gutters are visualized and there are  no loose bodies. Flexion and valgus force is applied to the knee and the medial compartment is entered. A spinal needle is passed into the joint through the site marked for the inferomedial portal. A small incision is made and the dilator passed into the joint. The findings for the medial compartment are 2 x 2 cm area of high grade chondromalacia/chondral defect without associated meniscal tear. The shaver is used to debride the  unstable cartilage to a stable bony base with stable edges. It is probed and found to be stable. The bone is abraded with the shaver for hopeful formation of fibrocartilage.    The intercondylar notch is visualized and the ACL appears normal. The lateral compartment is entered and the findings are normal.    The joint is again inspected and there are no other tears, defects or loose bodies identified. The arthroscopic equipment is then removed from the inferior portals which are closed with interrupted 4-0 nylon. 20 ml of .25% Marcaine with epinephrine are injected through the inflow cannula and the cannula is then removed and the portal closed with nylon. The incisions are cleaned and dried and a bulky sterile dressing is applied. The patient is then awakened and transported to recovery in stable condition.   09/06/2012, 4:06 PM

## 2012-09-06 NOTE — Preoperative (Signed)
Beta Blockers   Reason not to administer Beta Blockers:Not Applicable pt on beta blocker

## 2012-09-06 NOTE — Anesthesia Preprocedure Evaluation (Addendum)
Anesthesia Evaluation  Patient identified by MRN, date of birth, ID band Patient awake    Reviewed: Allergy & Precautions, H&P , NPO status , Patient's Chart, lab work & pertinent test results, reviewed documented beta blocker date and time   History of Anesthesia Complications (+) PONV  Airway Mallampati: II TM Distance: >3 FB Neck ROM: Full    Dental No notable dental hx. (+) Dental Advisory Given, Teeth Intact and Chipped   Pulmonary neg pulmonary ROS, asthma ,  breath sounds clear to auscultation  Pulmonary exam normal       Cardiovascular hypertension, Pt. on medications and Pt. on home beta blockers - Past MI and - CHF + dysrhythmias Supra Ventricular Tachycardia Rhythm:Regular Rate:Normal     Neuro/Psych  Headaches, Spinal cord stimulator negative psych ROS   GI/Hepatic Neg liver ROS, hiatal hernia, GERD-  Medicated,  Endo/Other  diabetes, Type 2, Oral Hypoglycemic AgentsHypothyroidism   Renal/GU negative Renal ROS  negative genitourinary   Musculoskeletal negative musculoskeletal ROS (+)   Abdominal   Peds negative pediatric ROS (+)  Hematology negative hematology ROS (+)   Anesthesia Other Findings   Reproductive/Obstetrics negative OB ROS                          Anesthesia Physical  Anesthesia Plan  ASA: II  Anesthesia Plan: General   Post-op Pain Management:    Induction: Intravenous  Airway Management Planned: LMA  Additional Equipment:   Intra-op Plan:   Post-operative Plan: Extubation in OR  Informed Consent: I have reviewed the patients History and Physical, chart, labs and discussed the procedure including the risks, benefits and alternatives for the proposed anesthesia with the patient or authorized representative who has indicated his/her understanding and acceptance.   Dental advisory given  Plan Discussed with: CRNA  Anesthesia Plan Comments:          Anesthesia Quick Evaluation

## 2012-09-06 NOTE — Interval H&P Note (Signed)
History and Physical Interval Note:  09/06/2012 3:20 PM  [name]  has presented today for surgery, with the diagnosis of RIGHT KNEE MENISCAL TEAR  The various methods of treatment have been discussed with the patient and family. After consideration of risks, benefits and other options for treatment, the patient has consented to  Procedure(s) (LRB) with comments: ARTHROSCOPY KNEE (Right) - WITH DEBRIDEMENT as a surgical intervention .  The patient's history has been reviewed, patient examined, no change in status, stable for surgery.  I have reviewed the patient's chart and labs.  Questions were answered to the patient's satisfaction.     [name]

## 2012-09-06 NOTE — Anesthesia Postprocedure Evaluation (Signed)
Anesthesia Post Note  Patient: [name]  Procedure(s) Performed: Procedure(s) (LRB): ARTHROSCOPY KNEE (Right)  Anesthesia type: General  Patient location: PACU  Post pain: Pain level controlled  Post assessment: Post-op Vital signs reviewed  Last Vitals: BP 120/61  Pulse 52  Temp 36.8 C (Oral)  Resp 15  SpO2 100%  Post vital signs: Reviewed  Level of consciousness: sedated  Complications: No apparent anesthesia complications

## 2012-09-07 ENCOUNTER — Encounter (HOSPITAL_COMMUNITY): Payer: Self-pay | Admitting: Orthopedic Surgery

## 2013-04-09 ENCOUNTER — Other Ambulatory Visit: Payer: Self-pay | Admitting: Orthopedic Surgery

## 2013-04-09 DIAGNOSIS — R52 Pain, unspecified: Secondary | ICD-10-CM

## 2013-05-01 ENCOUNTER — Other Ambulatory Visit: Payer: Self-pay | Admitting: Orthopedic Surgery

## 2013-05-04 ENCOUNTER — Ambulatory Visit
Admission: RE | Admit: 2013-05-04 | Discharge: 2013-05-04 | Disposition: A | Discharge status: Home / Self Care | Payer: 59 | Source: Ambulatory Visit | Attending: Orthopedic Surgery | Admitting: Orthopedic Surgery

## 2013-05-04 DIAGNOSIS — R52 Pain, unspecified: Secondary | ICD-10-CM

## 2013-05-11 ENCOUNTER — Other Ambulatory Visit: Payer: Self-pay | Admitting: Orthopedic Surgery

## 2013-05-17 ENCOUNTER — Encounter (HOSPITAL_COMMUNITY): Payer: Self-pay | Admitting: Pharmacy Technician

## 2013-05-24 ENCOUNTER — Encounter (HOSPITAL_COMMUNITY): Payer: Self-pay

## 2013-05-24 ENCOUNTER — Other Ambulatory Visit: Payer: BC Managed Care – PPO

## 2013-05-24 ENCOUNTER — Encounter (HOSPITAL_COMMUNITY)
Admission: RE | Admit: 2013-05-24 | Discharge: 2013-05-24 | Disposition: A | Discharge status: Home / Self Care | Payer: 59 | Source: Ambulatory Visit | Attending: Orthopedic Surgery | Admitting: Orthopedic Surgery

## 2013-05-24 ENCOUNTER — Inpatient Hospital Stay: Admission: RE | Admit: 2013-05-24 | Payer: BC Managed Care – PPO | Source: Ambulatory Visit

## 2013-05-24 ENCOUNTER — Other Ambulatory Visit: Payer: Self-pay

## 2013-05-24 DIAGNOSIS — J40 Bronchitis, not specified as acute or chronic: Secondary | ICD-10-CM

## 2013-05-24 DIAGNOSIS — M1710 Unilateral primary osteoarthritis, unspecified knee: Secondary | ICD-10-CM | POA: Insufficient documentation

## 2013-05-24 DIAGNOSIS — Z01812 Encounter for preprocedural laboratory examination: Secondary | ICD-10-CM | POA: Insufficient documentation

## 2013-05-24 DIAGNOSIS — Z01810 Encounter for preprocedural cardiovascular examination: Secondary | ICD-10-CM | POA: Insufficient documentation

## 2013-05-24 HISTORY — DX: Bronchitis, not specified as acute or chronic: J40

## 2013-05-24 HISTORY — DX: Personal history of urinary calculi: Z87.442

## 2013-05-24 LAB — URINALYSIS, ROUTINE W REFLEX MICROSCOPIC
Bilirubin Urine: NEGATIVE
Ketones, ur: NEGATIVE mg/dL
Nitrite: NEGATIVE
Urobilinogen, UA: 0.2 mg/dL (ref 0.0–1.0)

## 2013-05-24 LAB — CBC
HCT: 34.9 % — ABNORMAL LOW (ref 36.0–46.0)
Hemoglobin: 11.5 g/dL — ABNORMAL LOW (ref 12.0–15.0)
MCV: 93.6 fL (ref 78.0–100.0)
RBC: 3.73 MIL/uL — ABNORMAL LOW (ref 3.87–5.11)
RDW: 13.4 % (ref 11.5–15.5)
WBC: 6.8 10*3/uL (ref 4.0–10.5)

## 2013-05-24 LAB — COMPREHENSIVE METABOLIC PANEL
BUN: 13 mg/dL (ref 6–23)
CO2: 27 mEq/L (ref 19–32)
Chloride: 100 mEq/L (ref 96–112)
Creatinine, Ser: 0.94 mg/dL (ref 0.50–1.10)
GFR calc Af Amer: 78 mL/min — ABNORMAL LOW (ref >90)
GFR calc non Af Amer: 67 mL/min — ABNORMAL LOW (ref >90)
Glucose, Bld: 148 mg/dL — ABNORMAL HIGH (ref 70–99)
Total Bilirubin: 0.2 mg/dL — ABNORMAL LOW (ref 0.3–1.2)

## 2013-05-24 LAB — URINE MICROSCOPIC-ADD ON

## 2013-05-24 LAB — PROTIME-INR
INR: 1.01 (ref 0.00–1.49)
Prothrombin Time: 13.1 seconds (ref 11.6–15.2)

## 2013-05-24 NOTE — Progress Notes (Signed)
05-24-13 1415 Reviewed labs reports again . In error gave you the report of PCR 09-23-2012. Results of today remains pending. I will notify patient. W. Kennon Portela

## 2013-05-24 NOTE — Progress Notes (Signed)
05-24-13 Labs viewable in [place]. Patient has Positive PCR for Staph aureus. Please call Mupirocin Ointment in to pt's pharmacy [facility], [place], [place].

## 2013-05-24 NOTE — Pre-Procedure Instructions (Addendum)
05-24-13 EKG done. 05-25-13 0930 Pt. Notified and Dr. Deri Fuelling office, Positive for Staph aureus PCR screen of 05-24-13. 05-25-13 1610 patient confirmed message received of positive PCR screen-will use Mupirocin as directed. W. Kennon Portela

## 2013-05-24 NOTE — Progress Notes (Signed)
05-24-13 Report of PCR screen was in error, awaiting todays results. Patient and Dr. Deri Fuelling office notified.

## 2013-05-24 NOTE — Patient Instructions (Addendum)
20 [name]  05/24/2013   Your procedure is scheduled on9-8:   -2014  Report to [facility] at     0930   AM  For surgery at 1240 PM..  Call this number if you have problems the morning of surgery: [phone]  Or Presurgical Testing [phone](Ryane Canavan)      Do not eat food:After Midnight.  May have clear liquids:up to 6 Hours before arrival. Nothing after :  0600 AM  Clear liquids include soda, tea, black coffee, apple or grape juice, broth.  Take these medicines the morning of surgery with A SIP OF WATER: Alprazolam. Amlodipine. Colace. Hydrocodone. Levothyroxine. Metoprolol. Do not take any diabetic meds AM of surgery.   Do not wear jewelry, make-up or nail polish.  Do not wear lotions, powders, or perfumes. You may wear deodorant.  Do not shave 12 hours prior to first CHG shower(legs and under arms).(face and neck okay.)  Do not bring valuables to the hospital.  Contacts, dentures or bridgework,body piercing,  may not be worn into surgery.  Leave suitcase in the car. After surgery it may be brought to your room.  For patients admitted to the hospital, checkout time is 11:00 AM the day of discharge.   Patients discharged the day of surgery will not be allowed to drive home. Must have responsible person with you x 24 hours once discharged.  Name and phone number of your driver: Sharika Mosquera [phone] cell  Special Instructions: CHG(Chlorhedine 4%-"Hibiclens","Betasept","Aplicare") Shower Use Special Wash: see special instructions.(avoid face and genitals)   Please read over the following fact sheets that you were given: MRSA Information, Blood Transfusion fact sheet, Incentive Spirometry Instruction.    Failure to follow these instructions may result in Cancellation of your surgery.   Patient signature_______________________________________________________

## 2013-05-25 NOTE — Progress Notes (Signed)
05-25-13 0930 Patient definitely Positive for Staph aureus PCR on 05-24-13, will need Mupirocin Ointment treatment. [facility] [place].- [phone].

## 2013-06-03 ENCOUNTER — Other Ambulatory Visit: Payer: Self-pay | Admitting: Orthopedic Surgery

## 2013-06-03 NOTE — H&P (Signed)
[name]  DOB: January 06, 1956 Married / Language: English / Race: White Female  Date of Admission:  06/04/2013  Chief Complaint:  Right Knee Pain  History of Present Illness The patient is a 57 year old female who comes in for a preoperative History and Physical. The patient is scheduled for a right unicompartmental knee arthroplasty to be performed by Dr. Gus Rankin. Aluisio, MD at [facility] on 06/04/2013. The patient is a 57 year old female who presents for follow up of their knee. The patient is being followed for their right knee pain. They are now out from arthroscopy (7 1/2 weeks s/p cortisone injection, that provided relief for approximately 2 weeks). Symptoms reported today include: pain (constant, that increases with weight/changing positions) and swelling. Current treatment includes: icing. The following medication has been used for pain control: Oxycodone. She's had previous injections of Visco supplements which have not helped. She's had 2 arthroscopies and there are bone on bone changes in the medial compartment. Unfortunately, the knee is getting progressively worse. It's limiting her more. It's hurting all the time. She's requiring Oxycodone for pain control. She is ready to proceed with surgery. The patient will undergo a right unicompartmental repalcement. They have been treated conservatively in the past for the above stated problem and despite conservative measures, they continue to have progressive pain and severe functional limitations and dysfunction. They have failed non-operative management including home exercise, medications, and injections. It is felt that they would benefit from undergoing unicompartment joint replacement. Risks and benefits of the procedure have been discussed with the patient and they elect to proceed with surgery. There are no active contraindications to surgery such as ongoing infection or rapidly progressive neurological  disease.  Problem List Osteoarthritis, knee   Allergies Ceftin *CEPHALOSPORINS* Codeine Sulfate *ANALGESICS - OPIOID* Cymbalta *ANTIDEPRESSANTS*   Family History Diabetes Mellitus. mother and brother Congestive Heart Failure. brother Hypertension. mother and grandfather mothers side Cancer. grandmother mothers side Kidney disease. mother Rheumatoid Arthritis. sister and brother Osteoarthritis. grandmother mothers side Heart Disease. father, brother and grandfather mothers side Chronic Obstructive Lung Disease. brother Heart disease in female family member before age 62   Social History Drug/Alcohol Rehab (Previously). no Exercise. Exercises never Drug/Alcohol Rehab (Currently). no Illicit drug use. no Alcohol use. never consumed alcohol Marital status. married Living situation. live with spouse Number of flights of stairs before winded. 4-5 Children. 1 Pain Contract. no Tobacco use. never smoker Current work status. working full time [employer]. Living Will Post-Surgical Plans. Plan is to go home.   Medication History Norvasc ( Oral) Specific dose unknown - Active. Synthroid ( Oral) Specific dose unknown - Active. Toprol XL ( Oral) Specific dose unknown - Active. Xanax XR ( Oral) Specific dose unknown - Active. NexIUM ( Oral) Specific dose unknown - Active. Zocor ( Oral) Specific dose unknown - Active. Vitamin D (50000UNIT Capsule, Oral) Active. Glucophage ( Oral) Specific dose unknown - Active. Mobic (15MG  Tablet, Oral) Active. TraMADol HCl (50MG  Tablet, Oral) Active. Lidoderm ( External) Specific dose unknown - Active. Flector ( External) Specific dose unknown - Active. Voltaren (1% Gel, 4 Transdermal applied to affected area qid, Taken starting 04/25/2013) Active. Norco (5-325MG  Tablet, 1-2 Oral po bid as needed, Taken starting 05/24/2013) Active. Colace (100MG  Capsule, Oral) Active.   Past Surgical  History Hysterectomy. Date: 11/1978. complete (cancerous) Breast Biopsy. left Breast Mass; Local Excision. left D & C. Date: 05/1978. Neurostimulator Placement Left Lower Back. Date: 02/2009. Colonoscopy. Date: 08/2009. Renal Stent  Placement. Date: 07/2012. Removal Renal Stent. Date: 07/2012. Arthroscopic Knee Surgery - Right. June 2012, December 2013 Cholecystectomy. Date: 08/2012.   Medical History Osteoporosis Chronic Pain Gastroesophageal Reflux Disease Migraine Headache Hypercholesterolemia Migraine Headache Impaired Vision. wears glasses Anxiety Disorder Bronchitis. Past History Hypothyroidism Non-Insulin Dependent Diabetes Mellitus Kidney Stone Degenerative Disc Disease Measles Mumps Menopause Fibrocystic Disease Of Breast   Review of Systems General:Not Present- Chills, Fever, Night Sweats, Fatigue, Weight Gain, Weight Loss and Memory Loss. Skin:Not Present- Hives, Itching, Rash, Eczema and Lesions. HEENT:Present- Headache. Not Present- Tinnitus, Double Vision, Visual Loss, Hearing Loss and Dentures. Respiratory:Not Present- Shortness of breath with exertion, Shortness of breath at rest, Allergies, Coughing up blood and Chronic Cough. Cardiovascular:Not Present- Chest Pain, Racing/skipping heartbeats, Difficulty Breathing Lying Down, Murmur, Swelling and Palpitations. Gastrointestinal:Not Present- Bloody Stool, Heartburn, Abdominal Pain, Vomiting, Nausea, Constipation, Diarrhea, Difficulty Swallowing, Jaundice and Loss of appetitie. Female Genitourinary:Not Present- Blood in Urine, Urinary frequency, Weak urinary stream, Discharge, Flank Pain, Incontinence, Painful Urination, Urgency, Urinary Retention and Urinating at Night. Musculoskeletal:Present- Muscle Pain, Joint Swelling, Joint Pain, Back Pain and Morning Stiffness. Not Present- Muscle Weakness and Spasms. Neurological:Not Present- Tremor, Dizziness, Blackout spells, Paralysis, Difficulty  with balance and Weakness. Psychiatric:Not Present- Insomnia.   Vitals Weight: 170 lb Height: 64 in Weight was reported by patient. Height was reported by patient. Body Surface Area: 1.87 m Body Mass Index: 29.18 kg/m Pulse: 76 (Regular) Resp.: 16 (Unlabored) BP: 116/76 (Sitting, Right Arm, Standard)    Physical Exam The physical exam findings are as follows:   General Mental Status - Alert, cooperative and good historian. General Appearance- pleasant. Not in acute distress. Orientation- Oriented X3. Build & Nutrition- Well nourished and Well developed.   Head and Neck Head- normocephalic, atraumatic . Neck Global Assessment- supple. no bruit auscultated on the right and no bruit auscultated on the left.   Eye Pupil- Bilateral- Regular and Round. Motion- Bilateral- EOMI.   Chest and Lung Exam Auscultation: Breath sounds:- clear at anterior chest wall and - clear at posterior chest wall. Adventitious sounds:- No Adventitious sounds.   Cardiovascular Auscultation:Rhythm- Regular rate and rhythm. Heart Sounds- S1 WNL and S2 WNL. Murmurs & Other Heart Sounds:Auscultation of the heart reveals - No Murmurs.   Abdomen Palpation/Percussion:Tenderness- Abdomen is non-tender to palpation. Rigidity (guarding)- Abdomen is soft. Auscultation:Auscultation of the abdomen reveals - Bowel sounds normal.   Female Genitourinary Not done, not pertinent to present illness  Musculoskeletal On exam, she's a well-developed female, in no distress. Her right knee shows no effusion. Her ROM is 0-125 or 130 degrees. She's tender medially. There is no lateral tenderness or instability. There is minimal crepitus on ROM.  Assessment & Plan Osteoarthritis, knee (715.96) Impression: Right Knee  Note: Plan is for a Right Total Knee Replacement by Dr. Lequita Halt.  Plan is to go home.  PCP - Dr. Kathlee Nations  The patient does not have any  contraindications and will recieve TXA (tranexamic acid) prior to surgery.  Time spent ~30 mins.  Signed electronically by Lauraine Rinne, III PA-C

## 2013-06-04 ENCOUNTER — Encounter (HOSPITAL_COMMUNITY): Payer: Self-pay

## 2013-06-04 ENCOUNTER — Encounter (HOSPITAL_COMMUNITY)
Admission: RE | Disposition: A | Discharge status: Home Health | Payer: Self-pay | Source: Ambulatory Visit | Attending: Orthopedic Surgery

## 2013-06-04 ENCOUNTER — Inpatient Hospital Stay (HOSPITAL_COMMUNITY): Payer: 59 | Admitting: Anesthesiology

## 2013-06-04 ENCOUNTER — Inpatient Hospital Stay (HOSPITAL_COMMUNITY)
Admission: RE | Admit: 2013-06-04 | Discharge: 2013-06-06 | DRG: 470 | Disposition: A | Discharge status: Home Health | Payer: 59 | Source: Ambulatory Visit | Attending: Orthopedic Surgery | Admitting: Orthopedic Surgery

## 2013-06-04 ENCOUNTER — Encounter (HOSPITAL_COMMUNITY): Payer: Self-pay | Admitting: Anesthesiology

## 2013-06-04 DIAGNOSIS — K219 Gastro-esophageal reflux disease without esophagitis: Secondary | ICD-10-CM | POA: Diagnosis present

## 2013-06-04 DIAGNOSIS — E039 Hypothyroidism, unspecified: Secondary | ICD-10-CM | POA: Diagnosis present

## 2013-06-04 DIAGNOSIS — Z96651 Presence of right artificial knee joint: Secondary | ICD-10-CM

## 2013-06-04 DIAGNOSIS — E119 Type 2 diabetes mellitus without complications: Secondary | ICD-10-CM | POA: Diagnosis present

## 2013-06-04 DIAGNOSIS — M1710 Unilateral primary osteoarthritis, unspecified knee: Principal | ICD-10-CM | POA: Diagnosis present

## 2013-06-04 DIAGNOSIS — Z6829 Body mass index (BMI) 29.0-29.9, adult: Secondary | ICD-10-CM

## 2013-06-04 HISTORY — PX: PARTIAL KNEE ARTHROPLASTY: SHX2174

## 2013-06-04 LAB — GLUCOSE, CAPILLARY: Glucose-Capillary: 174 mg/dL — ABNORMAL HIGH (ref 70–99)

## 2013-06-04 LAB — TYPE AND SCREEN
ABO/RH(D): O NEG
Antibody Screen: NEGATIVE

## 2013-06-04 LAB — ABO/RH: ABO/RH(D): O NEG

## 2013-06-04 SURGERY — ARTHROPLASTY, KNEE, UNICOMPARTMENTAL
Anesthesia: General | Site: Knee | Laterality: Right | Wound class: Clean

## 2013-06-04 MED ORDER — VANCOMYCIN HCL IN DEXTROSE 1-5 GM/200ML-% IV SOLN
1000.0000 mg | Freq: Two times a day (BID) | INTRAVENOUS | Status: AC
  Administered 2013-06-05 – 2013-06-06 (×2): 1000 mg via INTRAVENOUS
  Filled 2013-06-04 (×2): qty 200

## 2013-06-04 MED ORDER — PANTOPRAZOLE SODIUM 40 MG PO TBEC
80.0000 mg | DELAYED_RELEASE_TABLET | Freq: Every day | ORAL | Status: DC
  Administered 2013-06-05 – 2013-06-06 (×2): 80 mg via ORAL
  Filled 2013-06-04 (×2): qty 2

## 2013-06-04 MED ORDER — MORPHINE SULFATE 2 MG/ML IJ SOLN
1.0000 mg | INTRAMUSCULAR | Status: DC | PRN
  Administered 2013-06-04 – 2013-06-05 (×4): 2 mg via INTRAVENOUS
  Filled 2013-06-04 (×4): qty 1

## 2013-06-04 MED ORDER — METOCLOPRAMIDE HCL 10 MG PO TABS: 5.0000 mg | ORAL_TABLET | Freq: Three times a day (TID) | ORAL | Status: DC | PRN

## 2013-06-04 MED ORDER — ALPRAZOLAM 0.5 MG PO TABS
0.5000 mg | ORAL_TABLET | Freq: Two times a day (BID) | ORAL | Status: DC
  Administered 2013-06-04 – 2013-06-06 (×4): 0.5 mg via ORAL
  Filled 2013-06-04 (×4): qty 1

## 2013-06-04 MED ORDER — PROMETHAZINE HCL 25 MG/ML IJ SOLN: 6.2500 mg | INTRAMUSCULAR | Status: DC | PRN

## 2013-06-04 MED ORDER — MENTHOL 3 MG MT LOZG: 1.0000 | LOZENGE | OROMUCOSAL | Status: DC | PRN

## 2013-06-04 MED ORDER — BISACODYL 10 MG RE SUPP: 10.0000 mg | Freq: Every day | RECTAL | Status: DC | PRN

## 2013-06-04 MED ORDER — SODIUM CHLORIDE 0.9 % IJ SOLN
INTRAMUSCULAR | Status: AC
  Filled 2013-06-04: qty 50

## 2013-06-04 MED ORDER — HYDROMORPHONE HCL PF 1 MG/ML IJ SOLN
INTRAMUSCULAR | Status: AC
  Filled 2013-06-04: qty 1

## 2013-06-04 MED ORDER — CHLORHEXIDINE GLUCONATE 4 % EX LIQD
60.0000 mL | Freq: Once | CUTANEOUS | Status: DC
  Filled 2013-06-04: qty 60

## 2013-06-04 MED ORDER — METFORMIN HCL 500 MG PO TABS
1000.0000 mg | ORAL_TABLET | Freq: Two times a day (BID) | ORAL | Status: DC
  Administered 2013-06-06: 08:00:00 1000 mg via ORAL
  Filled 2013-06-04 (×3): qty 2

## 2013-06-04 MED ORDER — BUPIVACAINE HCL (PF) 0.25 % IJ SOLN
INTRAMUSCULAR | Status: DC | PRN
  Administered 2013-06-04: 20 mL

## 2013-06-04 MED ORDER — LIDOCAINE 5 % EX PTCH
1.0000 | MEDICATED_PATCH | CUTANEOUS | Status: DC
  Administered 2013-06-04 – 2013-06-05 (×2): 1 via TRANSDERMAL
  Filled 2013-06-04 (×3): qty 1

## 2013-06-04 MED ORDER — DEXTROSE 5 % IV SOLN
500.0000 mg | Freq: Four times a day (QID) | INTRAVENOUS | Status: DC | PRN
  Administered 2013-06-04: 500 mg via INTRAVENOUS
  Filled 2013-06-04: qty 5

## 2013-06-04 MED ORDER — SODIUM CHLORIDE 0.9 % IV SOLN
INTRAVENOUS | Status: DC
  Administered 2013-06-04 – 2013-06-05 (×2): via INTRAVENOUS

## 2013-06-04 MED ORDER — METFORMIN HCL 500 MG PO TABS: 1000.0000 mg | ORAL_TABLET | Freq: Two times a day (BID) | ORAL | Status: DC

## 2013-06-04 MED ORDER — ACETAMINOPHEN 10 MG/ML IV SOLN
1000.0000 mg | Freq: Once | INTRAVENOUS | Status: AC
  Administered 2013-06-04: 1000 mg via INTRAVENOUS
  Filled 2013-06-04: qty 100

## 2013-06-04 MED ORDER — LACTATED RINGERS IV SOLN
INTRAVENOUS | Status: DC
  Administered 2013-06-04: 14:00:00 via INTRAVENOUS
  Administered 2013-06-04: 1000 mL via INTRAVENOUS

## 2013-06-04 MED ORDER — VANCOMYCIN HCL 10 G IV SOLR
1500.0000 mg | INTRAVENOUS | Status: AC
  Administered 2013-06-04: 1500 mg via INTRAVENOUS
  Filled 2013-06-04: qty 1500

## 2013-06-04 MED ORDER — TRANEXAMIC ACID 100 MG/ML IV SOLN
1000.0000 mg | INTRAVENOUS | Status: AC
  Administered 2013-06-04: 1000 mg via INTRAVENOUS
  Filled 2013-06-04: qty 10

## 2013-06-04 MED ORDER — RIVAROXABAN 10 MG PO TABS
10.0000 mg | ORAL_TABLET | Freq: Every day | ORAL | Status: DC
  Administered 2013-06-05 – 2013-06-06 (×2): 10 mg via ORAL
  Filled 2013-06-04 (×3): qty 1

## 2013-06-04 MED ORDER — DIPHENHYDRAMINE HCL 12.5 MG/5ML PO ELIX: 12.5000 mg | ORAL_SOLUTION | ORAL | Status: DC | PRN

## 2013-06-04 MED ORDER — KETOROLAC TROMETHAMINE 15 MG/ML IJ SOLN
7.5000 mg | Freq: Four times a day (QID) | INTRAMUSCULAR | Status: AC | PRN
  Administered 2013-06-04 – 2013-06-05 (×2): 7.5 mg via INTRAVENOUS
  Filled 2013-06-04 (×2): qty 1

## 2013-06-04 MED ORDER — SODIUM CHLORIDE 0.9 % IV SOLN: INTRAVENOUS | Status: DC

## 2013-06-04 MED ORDER — DEXAMETHASONE SODIUM PHOSPHATE 10 MG/ML IJ SOLN
INTRAMUSCULAR | Status: DC | PRN
  Administered 2013-06-04: 10 mg via INTRAVENOUS

## 2013-06-04 MED ORDER — BUPIVACAINE HCL (PF) 0.25 % IJ SOLN
INTRAMUSCULAR | Status: AC
  Filled 2013-06-04: qty 30

## 2013-06-04 MED ORDER — GLIMEPIRIDE 2 MG PO TABS
2.0000 mg | ORAL_TABLET | Freq: Every day | ORAL | Status: DC
Start: 2013-06-05 — End: 2013-06-06
  Administered 2013-06-05 – 2013-06-06 (×2): 2 mg via ORAL
  Filled 2013-06-04 (×3): qty 1

## 2013-06-04 MED ORDER — FENTANYL CITRATE 0.05 MG/ML IJ SOLN
INTRAMUSCULAR | Status: DC | PRN
  Administered 2013-06-04 (×4): 50 ug via INTRAVENOUS

## 2013-06-04 MED ORDER — LIDOCAINE HCL (CARDIAC) 20 MG/ML IV SOLN
INTRAVENOUS | Status: DC | PRN
  Administered 2013-06-04: 100 mg via INTRAVENOUS

## 2013-06-04 MED ORDER — TRAMADOL HCL 50 MG PO TABS: 50.0000 mg | ORAL_TABLET | Freq: Four times a day (QID) | ORAL | Status: DC | PRN

## 2013-06-04 MED ORDER — 0.9 % SODIUM CHLORIDE (POUR BTL) OPTIME
TOPICAL | Status: DC | PRN
  Administered 2013-06-04: 1000 mL

## 2013-06-04 MED ORDER — ONDANSETRON HCL 4 MG/2ML IJ SOLN
INTRAMUSCULAR | Status: DC | PRN
  Administered 2013-06-04: 4 mg via INTRAVENOUS

## 2013-06-04 MED ORDER — DOCUSATE SODIUM 100 MG PO CAPS
100.0000 mg | ORAL_CAPSULE | Freq: Two times a day (BID) | ORAL | Status: DC
Start: 2013-06-04 — End: 2013-06-06
  Administered 2013-06-04 – 2013-06-06 (×4): 100 mg via ORAL

## 2013-06-04 MED ORDER — ACETAMINOPHEN 500 MG PO TABS
1000.0000 mg | ORAL_TABLET | Freq: Four times a day (QID) | ORAL | Status: AC
  Administered 2013-06-04: 1000 mg via ORAL
  Filled 2013-06-04: qty 2

## 2013-06-04 MED ORDER — LEVOTHYROXINE SODIUM 50 MCG PO TABS
50.0000 ug | ORAL_TABLET | Freq: Every day | ORAL | Status: DC
  Administered 2013-06-05 – 2013-06-06 (×2): 50 ug via ORAL
  Filled 2013-06-04 (×3): qty 1

## 2013-06-04 MED ORDER — INSULIN ASPART 100 UNIT/ML ~~LOC~~ SOLN
0.0000 [IU] | Freq: Three times a day (TID) | SUBCUTANEOUS | Status: DC
  Administered 2013-06-04 – 2013-06-05 (×2): 3 [IU] via SUBCUTANEOUS

## 2013-06-04 MED ORDER — HYDROMORPHONE HCL 2 MG PO TABS
2.0000 mg | ORAL_TABLET | ORAL | Status: DC | PRN
  Administered 2013-06-04 (×2): 2 mg via ORAL
  Administered 2013-06-05 – 2013-06-06 (×8): 4 mg via ORAL
  Filled 2013-06-04 (×3): qty 2
  Filled 2013-06-04: qty 1
  Filled 2013-06-04 (×5): qty 2
  Filled 2013-06-04: qty 1

## 2013-06-04 MED ORDER — MORPHINE SULFATE 2 MG/ML IJ SOLN
INTRAMUSCULAR | Status: AC
  Administered 2013-06-04: 2 mg via INTRAVENOUS
  Filled 2013-06-04: qty 1

## 2013-06-04 MED ORDER — BUPIVACAINE LIPOSOME 1.3 % IJ SUSP
INTRAMUSCULAR | Status: DC | PRN
  Administered 2013-06-04: 20 mL

## 2013-06-04 MED ORDER — POLYETHYLENE GLYCOL 3350 17 G PO PACK: 17.0000 g | PACK | Freq: Every day | ORAL | Status: DC | PRN

## 2013-06-04 MED ORDER — PROPOFOL 10 MG/ML IV BOLUS
INTRAVENOUS | Status: DC | PRN
  Administered 2013-06-04: 160 mg via INTRAVENOUS

## 2013-06-04 MED ORDER — METHOCARBAMOL 500 MG PO TABS
500.0000 mg | ORAL_TABLET | Freq: Four times a day (QID) | ORAL | Status: DC | PRN
  Administered 2013-06-04 – 2013-06-06 (×5): 500 mg via ORAL
  Filled 2013-06-04 (×5): qty 1

## 2013-06-04 MED ORDER — ONDANSETRON HCL 4 MG PO TABS
4.0000 mg | ORAL_TABLET | Freq: Four times a day (QID) | ORAL | Status: DC | PRN
  Administered 2013-06-06: 10:00:00 4 mg via ORAL
  Filled 2013-06-04: qty 1

## 2013-06-04 MED ORDER — HYDROMORPHONE HCL PF 1 MG/ML IJ SOLN
0.2500 mg | INTRAMUSCULAR | Status: DC | PRN
  Administered 2013-06-04 (×4): 0.5 mg via INTRAVENOUS

## 2013-06-04 MED ORDER — METOCLOPRAMIDE HCL 5 MG/ML IJ SOLN: 5.0000 mg | Freq: Three times a day (TID) | INTRAMUSCULAR | Status: DC | PRN

## 2013-06-04 MED ORDER — KETOROLAC TROMETHAMINE 15 MG/ML IJ SOLN
INTRAMUSCULAR | Status: AC
  Filled 2013-06-04: qty 1

## 2013-06-04 MED ORDER — DEXAMETHASONE SODIUM PHOSPHATE 10 MG/ML IJ SOLN: 10.0000 mg | Freq: Once | INTRAMUSCULAR | Status: DC

## 2013-06-04 MED ORDER — BUPIVACAINE LIPOSOME 1.3 % IJ SUSP
20.0000 mL | Freq: Once | INTRAMUSCULAR | Status: DC
  Filled 2013-06-04: qty 20

## 2013-06-04 MED ORDER — SODIUM CHLORIDE 0.9 % IJ SOLN
INTRAMUSCULAR | Status: DC | PRN
  Administered 2013-06-04: 30 mL via INTRAVENOUS

## 2013-06-04 MED ORDER — FLEET ENEMA 7-19 GM/118ML RE ENEM: 1.0000 | ENEMA | Freq: Once | RECTAL | Status: AC | PRN

## 2013-06-04 MED ORDER — METOPROLOL TARTRATE 50 MG PO TABS
50.0000 mg | ORAL_TABLET | Freq: Two times a day (BID) | ORAL | Status: DC
  Administered 2013-06-04 – 2013-06-06 (×4): 50 mg via ORAL
  Filled 2013-06-04 (×5): qty 1

## 2013-06-04 MED ORDER — SIMVASTATIN 20 MG PO TABS
20.0000 mg | ORAL_TABLET | Freq: Every evening | ORAL | Status: DC
  Administered 2013-06-04 – 2013-06-05 (×2): 20 mg via ORAL
  Filled 2013-06-04 (×3): qty 1

## 2013-06-04 MED ORDER — ONDANSETRON HCL 4 MG/2ML IJ SOLN: 4.0000 mg | Freq: Four times a day (QID) | INTRAMUSCULAR | Status: DC | PRN

## 2013-06-04 MED ORDER — KETAMINE HCL 50 MG/ML IJ SOLN
INTRAMUSCULAR | Status: DC | PRN
  Administered 2013-06-04: 25 mg via INTRAMUSCULAR

## 2013-06-04 MED ORDER — NORTRIPTYLINE HCL 25 MG PO CAPS
100.0000 mg | ORAL_CAPSULE | Freq: Every day | ORAL | Status: DC
  Administered 2013-06-04 – 2013-06-05 (×2): 100 mg via ORAL
  Filled 2013-06-04 (×3): qty 4

## 2013-06-04 MED ORDER — DICLOFENAC EPOLAMINE 1.3 % TD PTCH
1.0000 | MEDICATED_PATCH | Freq: Every day | TRANSDERMAL | Status: DC
  Administered 2013-06-04 – 2013-06-06 (×3): 1 via TRANSDERMAL
  Filled 2013-06-04 (×5): qty 1

## 2013-06-04 MED ORDER — MIDAZOLAM HCL 5 MG/5ML IJ SOLN
INTRAMUSCULAR | Status: DC | PRN
  Administered 2013-06-04: 2 mg via INTRAVENOUS

## 2013-06-04 MED ORDER — PHENOL 1.4 % MT LIQD: 1.0000 | OROMUCOSAL | Status: DC | PRN

## 2013-06-04 MED ORDER — AMLODIPINE BESYLATE 2.5 MG PO TABS
2.5000 mg | ORAL_TABLET | Freq: Every day | ORAL | Status: DC
  Administered 2013-06-05 – 2013-06-06 (×2): 2.5 mg via ORAL
  Filled 2013-06-04 (×3): qty 1

## 2013-06-04 SURGICAL SUPPLY — 50 items
APPLICATOR COTTON TIP 6IN STRL (MISCELLANEOUS) ×2 IMPLANT
BAG ZIPLOCK 12X15 (MISCELLANEOUS) ×2 IMPLANT
BANDAGE ELASTIC 4 VELCRO ST LF (GAUZE/BANDAGES/DRESSINGS) ×2 IMPLANT
BANDAGE ELASTIC 6 VELCRO ST LF (GAUZE/BANDAGES/DRESSINGS) ×2 IMPLANT
BANDAGE ESMARK 6X9 LF (GAUZE/BANDAGES/DRESSINGS) ×1 IMPLANT
BANDAGE GAUZE ELAST BULKY 4 IN (GAUZE/BANDAGES/DRESSINGS) ×2 IMPLANT
BLADE SAW RECIPROCATING 77.5 (BLADE) ×2 IMPLANT
BLADE SAW SGTL 13.0X1.19X90.0M (BLADE) ×2 IMPLANT
BNDG ESMARK 6X9 LF (GAUZE/BANDAGES/DRESSINGS) ×2
BOWL SMART MIX CTS (DISPOSABLE) ×2 IMPLANT
BUR OVAL CARBIDE 4.0 (BURR) ×2 IMPLANT
CEMENT HV SMART SET (Cement) ×2 IMPLANT
CLOTH BEACON ORANGE TIMEOUT ST (SAFETY) ×2 IMPLANT
CUFF TOURN SGL QUICK 34 (TOURNIQUET CUFF) ×1
CUFF TRNQT CYL 34X4X40X1 (TOURNIQUET CUFF) ×1 IMPLANT
DEPRESSOR TONGUE BLADE STERILE (MISCELLANEOUS) ×2 IMPLANT
DRAPE EXTREMITY T 121X128X90 (DRAPE) ×2 IMPLANT
DRAPE POUCH INSTRU U-SHP 10X18 (DRAPES) ×2 IMPLANT
DRSG ADAPTIC 3X8 NADH LF (GAUZE/BANDAGES/DRESSINGS) ×2 IMPLANT
DRSG EMULSION OIL 3X16 NADH (GAUZE/BANDAGES/DRESSINGS) ×4 IMPLANT
DRSG PAD ABDOMINAL 8X10 ST (GAUZE/BANDAGES/DRESSINGS) ×2 IMPLANT
ELECT REM PT RETURN 9FT ADLT (ELECTROSURGICAL) ×2
ELECTRODE REM PT RTRN 9FT ADLT (ELECTROSURGICAL) ×1 IMPLANT
EVACUATOR 1/8 PVC DRAIN (DRAIN) ×2 IMPLANT
FACESHIELD LNG OPTICON STERILE (SAFETY) ×10 IMPLANT
GLOVE BIOGEL PI IND STRL 8 (GLOVE) ×1 IMPLANT
GLOVE BIOGEL PI INDICATOR 8 (GLOVE) ×1
GOWN STRL NON-REIN LRG LVL3 (GOWN DISPOSABLE) ×2 IMPLANT
GOWN STRL REIN XL XLG (GOWN DISPOSABLE) ×2 IMPLANT
HANDPIECE INTERPULSE COAX TIP (DISPOSABLE) ×1
IMMOBILIZER KNEE 20 (SOFTGOODS) ×2
IMMOBILIZER KNEE 20 THIGH 36 (SOFTGOODS) ×1 IMPLANT
KIT BASIN OR (CUSTOM PROCEDURE TRAY) ×2 IMPLANT
MANIFOLD NEPTUNE II (INSTRUMENTS) ×2 IMPLANT
NDL SAFETY ECLIPSE 18X1.5 (NEEDLE) ×1 IMPLANT
NEEDLE HYPO 18GX1.5 SHARP (NEEDLE) ×1
PACK GENERAL/GYN (CUSTOM PROCEDURE TRAY) ×2 IMPLANT
PACK TOTAL JOINT (CUSTOM PROCEDURE TRAY) ×2 IMPLANT
PADDING CAST COTTON 6X4 STRL (CAST SUPPLIES) ×2 IMPLANT
POSITIONER SURGICAL ARM (MISCELLANEOUS) ×2 IMPLANT
SET HNDPC FAN SPRY TIP SCT (DISPOSABLE) ×1 IMPLANT
SPONGE GAUZE 4X4 12PLY (GAUZE/BANDAGES/DRESSINGS) ×2 IMPLANT
STRIP CLOSURE SKIN 1/2X4 (GAUZE/BANDAGES/DRESSINGS) ×2 IMPLANT
SUT MNCRL AB 4-0 PS2 18 (SUTURE) ×2 IMPLANT
SUT VIC AB 2-0 CT1 27 (SUTURE) ×2
SUT VIC AB 2-0 CT1 TAPERPNT 27 (SUTURE) ×2 IMPLANT
SYR 20CC LL (SYRINGE) ×2 IMPLANT
SYR 50ML LL SCALE MARK (SYRINGE) ×2 IMPLANT
TOWEL OR 17X26 10 PK STRL BLUE (TOWEL DISPOSABLE) ×4 IMPLANT
TRAY FOLEY CATH 14FRSI W/METER (CATHETERS) ×2 IMPLANT

## 2013-06-04 NOTE — Anesthesia Preprocedure Evaluation (Addendum)
Anesthesia Evaluation  Patient identified by MRN, date of birth, ID band Patient awake    Reviewed: Allergy & Precautions, H&P , NPO status , Patient's Chart, lab work & pertinent test results  Airway Mallampati: II TM Distance: <3 FB Neck ROM: Full    Dental no notable dental hx.    Pulmonary neg pulmonary ROS,  breath sounds clear to auscultation  Pulmonary exam normal       Cardiovascular + dysrhythmias Supra Ventricular Tachycardia Rhythm:Regular Rate:Normal     Neuro/Psych negative neurological ROS  negative psych ROS   GI/Hepatic Neg liver ROS, GERD-  Medicated,  Endo/Other  diabetes, Type 2Hypothyroidism   Renal/GU negative Renal ROS  negative genitourinary   Musculoskeletal negative musculoskeletal ROS (+)   Abdominal   Peds negative pediatric ROS (+)  Hematology negative hematology ROS (+)   Anesthesia Other Findings   Reproductive/Obstetrics negative OB ROS                           Anesthesia Physical Anesthesia Plan  ASA: II  Anesthesia Plan: General   Post-op Pain Management:    Induction: Intravenous  Airway Management Planned: LMA and Oral ETT  Additional Equipment:   Intra-op Plan:   Post-operative Plan:   Informed Consent: I have reviewed the patients History and Physical, chart, labs and discussed the procedure including the risks, benefits and alternatives for the proposed anesthesia with the patient or authorized representative who has indicated his/her understanding and acceptance.     Plan Discussed with: CRNA and Surgeon  Anesthesia Plan Comments:        Anesthesia Quick Evaluation

## 2013-06-04 NOTE — Brief Op Note (Signed)
06/04/2013  1:53 PM  PATIENT:  [name]  57 y.o. female  PRE-OPERATIVE DIAGNOSIS:  RIGHT KNEE COMPARTMENTAL MEDIAL OA   POST-OPERATIVE DIAGNOSIS:  Right Knee Compartmental medial OA  PROCEDURE:  Procedure(s): RIGHT KNEE MEDIAL UNICOMPARTMENTAL ARTHROPLASTY  (Right)  SURGEON:  Surgeon(s) and Role:    * [name], MD - Primary  PHYSICIAN ASSISTANT:   ASSISTANTS: Avel Peace, PA-C   ANESTHESIA:   general  EBL:  Total I/O In: 1000 [I.V.:1000] Out: 170 [Urine:150; Blood:20]  BLOOD ADMINISTERED:none  DRAINS: (Medium) Hemovact drain(s) in the right knee with  Suction Open   LOCAL MEDICATIONS USED:  OTHER Exparel  COUNTS:  YES  TOURNIQUET:  34 minutes @300  mm Hg  DICTATION: .Other Dictation: Dictation Number [id number]  PLAN OF CARE: Admit to inpatient   PATIENT DISPOSITION:  PACU - hemodynamically stable.

## 2013-06-04 NOTE — Interval H&P Note (Signed)
History and Physical Interval Note:  06/04/2013 11:59 AM  [name]  has presented today for surgery, with the diagnosis of RIGHT KNEE COMPARTMENTAL MEDIAL OA   The various methods of treatment have been discussed with the patient and family. After consideration of risks, benefits and other options for treatment, the patient has consented to  Procedure(s): RIGHT KNEE MEDIAL UNICOMPARTMENTAL ARTHROPLASTY  (Right) as a surgical intervention .  The patient's history has been reviewed, patient examined, no change in status, stable for surgery.  I have reviewed the patient's chart and labs.  Questions were answered to the patient's satisfaction.     [name]

## 2013-06-04 NOTE — Anesthesia Postprocedure Evaluation (Signed)
  Anesthesia Post-op Note  Patient: [name]  Procedure(s) Performed: Procedure(s) (LRB): RIGHT KNEE MEDIAL UNICOMPARTMENTAL ARTHROPLASTY  (Right)  Patient Location: PACU  Anesthesia Type: General  Level of Consciousness: awake and alert   Airway and Oxygen Therapy: Patient Spontanous Breathing  Post-op Pain: mild  Post-op Assessment: Post-op Vital signs reviewed, Patient's Cardiovascular Status Stable, Respiratory Function Stable, Patent Airway and No signs of Nausea or vomiting  Last Vitals:  Filed Vitals:   06/04/13 0945  BP: 117/61  Pulse: 69  Temp: 37 C  Resp: 20    Post-op Vital Signs: stable   Complications: No apparent anesthesia complications

## 2013-06-04 NOTE — H&P (View-Only) (Signed)
[name]  DOB: 09/27/1955 Married / Language: English / Race: White Female  Date of Admission:  06/04/2013  Chief Complaint:  Right Knee Pain  History of Present Illness The patient is a 57 year old female who comes in for a preoperative History and Physical. The patient is scheduled for a right unicompartmental knee arthroplasty to be performed by Dr. Frank V. Aluisio, MD at [facility] on 06/04/2013. The patient is a 57 year old female who presents for follow up of their knee. The patient is being followed for their right knee pain. They are now out from arthroscopy (7 1/2 weeks s/p cortisone injection, that provided relief for approximately 2 weeks). Symptoms reported today include: pain (constant, that increases with weight/changing positions) and swelling. Current treatment includes: icing. The following medication has been used for pain control: Oxycodone. She's had previous injections of Visco supplements which have not helped. She's had 2 arthroscopies and there are bone on bone changes in the medial compartment. Unfortunately, the knee is getting progressively worse. It's limiting her more. It's hurting all the time. She's requiring Oxycodone for pain control. She is ready to proceed with surgery. The patient will undergo a right unicompartmental repalcement. They have been treated conservatively in the past for the above stated problem and despite conservative measures, they continue to have progressive pain and severe functional limitations and dysfunction. They have failed non-operative management including home exercise, medications, and injections. It is felt that they would benefit from undergoing unicompartment joint replacement. Risks and benefits of the procedure have been discussed with the patient and they elect to proceed with surgery. There are no active contraindications to surgery such as ongoing infection or rapidly progressive neurological  disease.  Problem List Osteoarthritis, knee   Allergies Ceftin *CEPHALOSPORINS* Codeine Sulfate *ANALGESICS - OPIOID* Cymbalta *ANTIDEPRESSANTS*   Family History Diabetes Mellitus. mother and brother Congestive Heart Failure. brother Hypertension. mother and grandfather mothers side Cancer. grandmother mothers side Kidney disease. mother Rheumatoid Arthritis. sister and brother Osteoarthritis. grandmother mothers side Heart Disease. father, brother and grandfather mothers side Chronic Obstructive Lung Disease. brother Heart disease in female family member before age 55   Social History Drug/Alcohol Rehab (Previously). no Exercise. Exercises never Drug/Alcohol Rehab (Currently). no Illicit drug use. no Alcohol use. never consumed alcohol Marital status. married Living situation. live with spouse Number of flights of stairs before winded. 4-5 Children. 1 Pain Contract. no Tobacco use. never smoker Current work status. working full time Advance Directives. Living Will Post-Surgical Plans. Plan is to go home.   Medication History Norvasc ( Oral) Specific dose unknown - Active. Synthroid ( Oral) Specific dose unknown - Active. Toprol XL ( Oral) Specific dose unknown - Active. Xanax XR ( Oral) Specific dose unknown - Active. NexIUM ( Oral) Specific dose unknown - Active. Zocor ( Oral) Specific dose unknown - Active. Vitamin D (50000UNIT Capsule, Oral) Active. Glucophage ( Oral) Specific dose unknown - Active. Mobic (15MG Tablet, Oral) Active. TraMADol HCl (50MG Tablet, Oral) Active. Lidoderm ( External) Specific dose unknown - Active. Flector ( External) Specific dose unknown - Active. Voltaren (1% Gel, 4 Transdermal applied to affected area qid, Taken starting 04/25/2013) Active. Norco (5-325MG Tablet, 1-2 Oral po bid as needed, Taken starting 05/24/2013) Active. Colace (100MG Capsule, Oral) Active.   Past Surgical  History Hysterectomy. Date: 11/1978. complete (cancerous) Breast Biopsy. left Breast Mass; Local Excision. left D & C. Date: 05/1978. Neurostimulator Placement Left Lower Back. Date: 02/2009. Colonoscopy. Date: 08/2009. Renal Stent   Placement. Date: 07/2012. Removal Renal Stent. Date: 07/2012. Arthroscopic Knee Surgery - Right. June 2012, December 2013 Cholecystectomy. Date: 08/2012.   Medical History Osteoporosis Chronic Pain Gastroesophageal Reflux Disease Migraine Headache Hypercholesterolemia Migraine Headache Impaired Vision. wears glasses Anxiety Disorder Bronchitis. Past History Hypothyroidism Non-Insulin Dependent Diabetes Mellitus Kidney Stone Degenerative Disc Disease Measles Mumps Menopause Fibrocystic Disease Of Breast   Review of Systems General:Not Present- Chills, Fever, Night Sweats, Fatigue, Weight Gain, Weight Loss and Memory Loss. Skin:Not Present- Hives, Itching, Rash, Eczema and Lesions. HEENT:Present- Headache. Not Present- Tinnitus, Double Vision, Visual Loss, Hearing Loss and Dentures. Respiratory:Not Present- Shortness of breath with exertion, Shortness of breath at rest, Allergies, Coughing up blood and Chronic Cough. Cardiovascular:Not Present- Chest Pain, Racing/skipping heartbeats, Difficulty Breathing Lying Down, Murmur, Swelling and Palpitations. Gastrointestinal:Not Present- Bloody Stool, Heartburn, Abdominal Pain, Vomiting, Nausea, Constipation, Diarrhea, Difficulty Swallowing, Jaundice and Loss of appetitie. Female Genitourinary:Not Present- Blood in Urine, Urinary frequency, Weak urinary stream, Discharge, Flank Pain, Incontinence, Painful Urination, Urgency, Urinary Retention and Urinating at Night. Musculoskeletal:Present- Muscle Pain, Joint Swelling, Joint Pain, Back Pain and Morning Stiffness. Not Present- Muscle Weakness and Spasms. Neurological:Not Present- Tremor, Dizziness, Blackout spells, Paralysis, Difficulty  with balance and Weakness. Psychiatric:Not Present- Insomnia.   Vitals Weight: 170 lb Height: 64 in Weight was reported by patient. Height was reported by patient. Body Surface Area: 1.87 m Body Mass Index: 29.18 kg/m Pulse: 76 (Regular) Resp.: 16 (Unlabored) BP: 116/76 (Sitting, Right Arm, Standard)    Physical Exam The physical exam findings are as follows:   General Mental Status - Alert, cooperative and good historian. General Appearance- pleasant. Not in acute distress. Orientation- Oriented X3. Build & Nutrition- Well nourished and Well developed.   Head and Neck Head- normocephalic, atraumatic . Neck Global Assessment- supple. no bruit auscultated on the right and no bruit auscultated on the left.   Eye Pupil- Bilateral- Regular and Round. Motion- Bilateral- EOMI.   Chest and Lung Exam Auscultation: Breath sounds:- clear at anterior chest wall and - clear at posterior chest wall. Adventitious sounds:- No Adventitious sounds.   Cardiovascular Auscultation:Rhythm- Regular rate and rhythm. Heart Sounds- S1 WNL and S2 WNL. Murmurs & Other Heart Sounds:Auscultation of the heart reveals - No Murmurs.   Abdomen Palpation/Percussion:Tenderness- Abdomen is non-tender to palpation. Rigidity (guarding)- Abdomen is soft. Auscultation:Auscultation of the abdomen reveals - Bowel sounds normal.   Female Genitourinary Not done, not pertinent to present illness  Musculoskeletal On exam, she's a well-developed female, in no distress. Her right knee shows no effusion. Her ROM is 0-125 or 130 degrees. She's tender medially. There is no lateral tenderness or instability. There is minimal crepitus on ROM.  Assessment & Plan Osteoarthritis, knee (715.96) Impression: Right Knee  Note: Plan is for a Right Total Knee Replacement by Dr. Aluisio.  Plan is to go home.  PCP - Dr. Paul Eason  The patient does not have any  contraindications and will recieve TXA (tranexamic acid) prior to surgery.  Time spent ~30 mins.  Signed electronically by Alexzandrew L Perkins, III PA-C  

## 2013-06-04 NOTE — Transfer of Care (Signed)
Immediate Anesthesia Transfer of Care Note  Patient: [name]  Procedure(s) Performed: Procedure(s): RIGHT KNEE MEDIAL UNICOMPARTMENTAL ARTHROPLASTY  (Right)  Patient Location: PACU  Anesthesia Type:General  Level of Consciousness: sedated  Airway & Oxygen Therapy: Patient Spontanous Breathing and Patient connected to face mask oxygen  Post-op Assessment: Report given to PACU RN and Post -op Vital signs reviewed and stable  Post vital signs: Reviewed and stable  Complications: No apparent anesthesia complications

## 2013-06-05 ENCOUNTER — Encounter (HOSPITAL_COMMUNITY): Payer: Self-pay | Admitting: Orthopedic Surgery

## 2013-06-05 LAB — BASIC METABOLIC PANEL
BUN: 10 mg/dL (ref 6–23)
Chloride: 102 mEq/L (ref 96–112)
Creatinine, Ser: 0.79 mg/dL (ref 0.50–1.10)
GFR calc Af Amer: >90 mL/min (ref >90)
GFR calc non Af Amer: >90 mL/min (ref >90)
Potassium: 4.5 mEq/L (ref 3.5–5.1)

## 2013-06-05 LAB — GLUCOSE, CAPILLARY
Glucose-Capillary: 83 mg/dL (ref 70–99)
Glucose-Capillary: 118 mg/dL — ABNORMAL HIGH (ref 70–99)

## 2013-06-05 LAB — CBC
HCT: 31.3 % — ABNORMAL LOW (ref 36.0–46.0)
MCHC: 33.9 g/dL (ref 30.0–36.0)
Platelets: 225 10*3/uL (ref 150–400)
RDW: 13.1 % (ref 11.5–15.5)
WBC: 7.6 10*3/uL (ref 4.0–10.5)

## 2013-06-05 MED ORDER — HYDROMORPHONE HCL 2 MG PO TABS: 2.0000 mg | ORAL_TABLET | ORAL | Status: DC | PRN

## 2013-06-05 MED ORDER — RIVAROXABAN 10 MG PO TABS: 10.0000 mg | ORAL_TABLET | Freq: Every day | ORAL | Status: DC

## 2013-06-05 MED ORDER — HYDROMORPHONE HCL PF 1 MG/ML IJ SOLN
0.5000 mg | INTRAMUSCULAR | Status: DC | PRN
  Administered 2013-06-05: 16:00:00 0.5 mg via INTRAVENOUS
  Administered 2013-06-05 – 2013-06-06 (×5): 1 mg via INTRAVENOUS
  Filled 2013-06-05 (×6): qty 1

## 2013-06-05 MED ORDER — METHOCARBAMOL 500 MG PO TABS: 500.0000 mg | ORAL_TABLET | Freq: Four times a day (QID) | ORAL | Status: DC | PRN

## 2013-06-05 NOTE — Progress Notes (Signed)
Physical Therapy Treatment Patient Details Name: Cambria Osten MRN: [id number] DOB: November 17, 1955 Today's Date: 06/05/2013 Time: 1478-2956 PT Time Calculation (min): 18 min  PT Assessment / Plan / Recommendation  History of Present Illness [name] on 06/04/13   PT Comments   Pt with 7 pain, RN speaking with MD  Follow Up Recommendations  Home health PT     Does the patient have the potential to tolerate intense rehabilitation     Barriers to Discharge        Equipment Recommendations  None recommended by PT    Recommendations for Other Services    Frequency 7X/week   Progress towards PT Goals Progress towards PT goals: Progressing toward goals  Plan Current plan remains appropriate    Precautions / Restrictions Precautions Precautions: Knee Required Braces or Orthoses: Knee Immobilizer - Right        Mobility  Bed Mobility Bed Mobility: Sit to Supine Sit to Supine: 4: Min assist Details for Bed Mobility Assistance: cues for hooking L foot under R to self support while placing fot onto bed. Transfers Transfers: Sit to Stand Sit to Stand: With armrests;From chair/3-in-1 Stand to Sit: 4: Min guard;With upper extremity assist;To toilet;To bed Details for Transfer Assistance: verbal cues for hand placement Ambulation/Gait Ambulation/Gait Assistance: 4: Min assist Ambulation Distance (Feet): 100 Feet Assistive device: Rolling walker Ambulation/Gait Assistance Details: cues for sequence , rolling walker. Gait Pattern: Step-to pattern;Antalgic;Decreased step length - right;Decreased stance time - right    Exercises Total Joint Exercises Quad Sets: AROM;Right;10 reps;Supine Heel Slides: AAROM;Right;10 reps;Supine Straight Leg Raises: AAROM;Right;10 reps;Supine   PT Diagnosis:    PT Problem List:   PT Treatment Interventions:     PT Goals (current goals can now be found in the care plan section)    Visit Information  Last PT Received On: 06/05/13 Assistance Needed:  +1 History of Present Illness: [name] on 06/04/13    Subjective Data      Cognition       Balance     End of Session PT - End of Session Equipment Utilized During Treatment: Right knee immobilizer Activity Tolerance: Patient limited by fatigue;Patient limited by pain Patient left: in bed;with call bell/phone within reach;with family/visitor present Nurse Communication: Mobility status   GP     Rada Hay 06/05/2013, 4:52 PM

## 2013-06-05 NOTE — Evaluation (Signed)
Occupational Therapy Evaluation Patient Details Name: [name] MRN: [id number] DOB: December 11, 1955 Today's Date: 06/05/2013 Time: 4098-1191 OT Time Calculation (min): 17 min  OT Assessment / Plan / Recommendation History of present illness [name] on 06/04/13   Clinical Impression   Pt overall min guard assist level with ADL and has assist at home. She states husband can help with LB self care. She plans to sponge bathe initially. No further OT needs. Feel she will progress with functional transfers with PT.     OT Assessment  Patient does not need any further OT services    Follow Up Recommendations  No OT follow up;Supervision/Assistance - 24 hour    Barriers to Discharge      Equipment Recommendations  None recommended by OT    Recommendations for Other Services    Frequency       Precautions / Restrictions Precautions Precautions: Knee Required Braces or Orthoses: Knee Immobilizer - Right Restrictions Weight Bearing Restrictions: No   Pertinent Vitals/Pain 7/10 R knee; reposition and ice    ADL  Eating/Feeding: Simulated;Independent Where Assessed - Eating/Feeding: Chair Grooming: Performed;Wash/dry hands;Min guard Where Assessed - Grooming: Unsupported standing Upper Body Bathing: Simulated;Chest;Right arm;Left arm;Abdomen;Set up Where Assessed - Upper Body Bathing: Unsupported sitting Lower Body Bathing: Simulated;Minimal assistance Where Assessed - Lower Body Bathing: Supported sit to stand Upper Body Dressing: Simulated;Set up Where Assessed - Upper Body Dressing: Unsupported sitting Lower Body Dressing: Simulated;Moderate assistance Where Assessed - Lower Body Dressing: Supported sit to stand Toilet Transfer: Performed;Min Psychologist, sport and exercise: Comfort height toilet;Grab bars Toileting - Architect and Hygiene: Simulated;Min guard Where Assessed - Engineer, mining and Hygiene: Sit to stand from 3-in-1 or toilet Equipment  Used: Rolling walker ADL Comments: Pt planning to sponge bathe initially but has a tubseat for when she is ready to shower. Discussed letting HHPT practice dry run of tub transfer when she feels ready to do shower. Pt planning to have husband assist with LB selft care. Pt has a standard height commode but states vanity is right beside commode and toilet paper holder. Cautioned about using toilet paper holder as support and use vanity on L as her pushing up support.     OT Diagnosis:    OT Problem List:   OT Treatment Interventions:     OT Goals(Current goals can be found in the care plan section) Acute Rehab OT Goals Patient Stated Goal: To walk better, have no pain  Visit Information  Last OT Received On: 06/05/13 Assistance Needed: +1 History of Present Illness: [name] on 06/04/13       Prior Functioning     Home Living Family/patient expects to be discharged to:: Private residence Living Arrangements: Spouse/significant other Available Help at Discharge: Family Type of Home: House Home Access: Stairs to enter Secretary/administrator of Steps: 1 Home Layout: Able to live on main level with bedroom/bathroom Home Equipment: Walker - 2 wheels;Cane - single point;Shower seat Prior Function Level of Independence: Independent Communication Communication: No difficulties         Vision/Perception     Cognition  Cognition Arousal/Alertness: Awake/alert Behavior During Therapy: WFL for tasks assessed/performed Overall Cognitive Status: Within Functional Limits for tasks assessed    Extremity/Trunk Assessment Upper Extremity Assessment Upper Extremity Assessment: Overall WFL for tasks assessed Lower Extremity Assessment     Mobility  Transfers Transfers: Sit to Stand;Stand to Sit Sit to Stand: 4: Min guard;With upper extremity assist;From chair/3-in-1;From toilet Stand to Sit: 4: Min guard;With upper extremity  assist;To toilet;To chair/3-in-1 Details for Transfer  Assistance: verbal cues for hand placement        Balance     End of Session OT - End of Session Equipment Utilized During Treatment: Gait belt Activity Tolerance: Patient tolerated treatment well Patient left: in chair;with call bell/phone within reach  GO     Lennox Laity [phone] 06/05/2013, 12:11 PM

## 2013-06-05 NOTE — Op Note (Signed)
[name], [name]              ACCOUNT NO.:  [id number]  MEDICAL RECORD NO.:  [id number]  LOCATION:  1613                         FACILITY:  [facility]  PHYSICIAN:  Ollen Gross, M.D.    DATE OF BIRTH:  02-Jul-1956  DATE OF PROCEDURE:  06/04/2013 DATE OF DISCHARGE:                              OPERATIVE REPORT   PREOPERATIVE DIAGNOSIS:  Right knee medial unicompartmental arthritis.  POSTOPERATIVE DIAGNOSIS:  Right knee medial unicompartmental arthritis.  PROCEDURE:  Right knee medial unicompartmental arthroplasty.  SURGEON:  Ollen Gross, M.D.  ASSISTANT:  Alexzandrew L. Perkins, PA-C  ANESTHESIA:  General.  ESTIMATED BLOOD LOSS:  Minimal.  DRAINS:  Hemovac x1.  TOURNIQUET TIME:  34 minutes at 300 mmHg.  COMPLICATIONS:  None.  CONDITION:  Stable to recovery.  BRIEF CLINICAL NOTE:  [name] is a 57 year old female who has significant medial compartment osteoarthritis of her right knee.  She has had previous arthroscopy, which show the arthritis is isolated to the medial compartment.  She has failed nonoperative management including arthroscopy, cortisone, and Visco supplement injections, and presents now for medial unicompartmental arthroplasty.  PROCEDURE IN DETAIL:  After successful administration of general anesthetic, tourniquet was placed high on her right thigh and her right lower extremity was prepped and draped in the usual sterile fashion. Extremities wrapped in Esmarch, knee flexed, and tourniquet inflated to 300 mmHg.  A midline incision was made with a 10-blade through subcutaneous tissue to the level of the extensor mechanism.  Medial parapatellar arthrotomy was performed.  Soft tissue on the proximal medial tibia subperiosteally elevated to the joint line with a knife and into the semimembranosus bursa with a Cobb elevator.  The lateral soft tissues left intact.  I subluxed the patella laterally.  ACL was intact. She has a large ulcerated chondral lesion  on the medial femoral condyle with exposed bone there and medial tibial plateau.  We then placed the cutting jig for the femoral component onto the medial femoral condyle and had perfect fit.  We then traced it with a marking pen and used a ring curette to remove all of the remaining cartilage within that circumscribed region.  The cutting block was then placed again and pinned in position.  The posterior femoral cut was then made off the block.  Wafer of bone was removed.  We then drilled the 2 distal lug holes through the cutting block.  The spacer block was then placed between the femur and tibia, and the B chip was the correct thickness.  This had excellent stability through full range of motion, and the sizing is perfect along the medial tibial plateau.  Medial osteophytes were removed and the remainder of the medial meniscus was removed.  We also removed the remainder of the cartilage on the medial tibial plateau, although minimal cartilage was remaining.  With a chip in place, the cutting guide was then positioned. The guide was pinned and then the vertical cut was made with the reciprocating saw and the proximal tibial cut made with the oscillating saw.  The bone was then removed.  The trial tibia was placed and had perfect fit.  We then prepared the proximal tibia with 2 lug drills  and keel punch.  Trial components were placed with the 6 mm thick polyethylene and full extension was achieved with excellent balance throughout full range of motion.  The holes were then drilled into the surface of the distal femur to create conduit for the cement.  The cut bone surfaces were then prepared with pulsatile lavage.  Cement was mixed and once ready for implantation, the tibial and femoral components were cemented into position.  The trial spacer was placed and knee held in full extension, all extruded cement removed.  Once the cement was fully hardened, a permanent polyethylene was  placed into the tibial tray.  Wound was then copiously irrigated with saline solution and Exparel 20 mL mixed with 30 mL of saline injected into the extensor mechanism, medial and lateral gutters and the periosteum of the femur. 20 mL of 0.25% bupivacaine was then also injected into the same tissues. Wound was again irrigated and the arthrotomy closed over Hemovac drain with running #1 V-Loc suture.  The tourniquet was then released.  Total tourniquet time of 34 minutes.  Subcu was closed with interrupted 2-0 Vicryl and subcuticular running 4-0 Monocryl.  The incision was then cleaned and dried and Steri-Strips and bulky sterile dressing applied. Drain was hooked to suction.  The patient was then placed into a knee immobilizer, awakened, transferred to recovery in stable condition.  Please note that a surgical assistant was a medical necessity for this procedure in order to protect the vital ligaments and vital neurovascular structures.     Ollen Gross, M.D.     FA/MEDQ  D:  06/04/2013  T:  06/05/2013  Job:  [id number]

## 2013-06-05 NOTE — Care Management Note (Signed)
  Page 2 of 2   06/05/2013     3:59:32 PM   CARE MANAGEMENT NOTE 06/05/2013  Patient:  [facility]   Account Number:  [id number]  Date Initiated:  06/05/2013  Documentation initiated by:  Colleen Can  Subjective/Objective Assessment:   Dx rt medial knee unicompartmental arthroplasty     Action/Plan:   CM spoke with patient. Plans are for her to return to her home in [place] where spouse will be caregiver. She lready has RW, shower chair, commode seat and crutches.   Anticipated DC Date:  06/06/2013   Anticipated DC Plan:  HOME W HOME HEALTH SERVICES      DC Planning Services  CM consult  DC out of service area      [facility] Choice  HOME HEALTH   Choice offered to / List presented to:  C-1 Patient        HH arranged  HH-2 PT      [facility] agency  [facility]   Status of service:  In process, will continue to follow Medicare Important Message given?   (If response is "NO", the following Medicare IM given date fields will be blank) Date Medicare IM given:   Date Additional Medicare IM given:    Discharge Disposition:    Per UR Regulation:  Reviewed for med. necessity/level of care/duration of stay  If discussed at Long Length of Stay Meetings, dates discussed:    Comments:  06/05/2013 Colleen Can BSN RN CCM [phone] TCT [facility] in [place]; spoke with Lurena Joiner who requested that I send insurance information for patient via fax-[phone]. She will call me regarding whether they can accept patient.  Return call from Lurena Joiner at [facility] in [place] that they would be able to provide HHPT services to patient with start date of thurs-06/07/2013. HH orders, face to face, op note, H&P, PT notes, and face sheet faxed to [phone] with confirmation.

## 2013-06-05 NOTE — Progress Notes (Signed)
Physical Therapy Treatment Patient Details Name: [name] MRN: [id number] DOB: [birth year] Today's Date: 06/05/2013 Time: 7829-5621 PT Time Calculation (min): 12 min  PT Assessment / Plan / Recommendation  History of Present Illness [name] on 06/04/13   PT Comments   Pt continues with decreased pain control  Follow Up Recommendations  Home health PT     Does the patient have the potential to tolerate intense rehabilitation     Barriers to Discharge        Equipment Recommendations  None recommended by PT    Recommendations for Other Services    Frequency 7X/week   Progress towards PT Goals Progress towards PT goals: Progressing toward goals  Plan Current plan remains appropriate    Precautions / Restrictions Precautions Precautions: Knee Required Braces or Orthoses: Knee Immobilizer - Right   Pertinent Vitals/Pain Rates 6 R knee, RN present to give meds.    Mobility  Bed Mobility Bed Mobility: Sit to Supine Sit to Supine: 4: Min assist Details for Bed Mobility Assistance: cues for hooking L foot under R to self support while placing fot onto bed. Transfers Transfers: Sit to Stand Sit to Stand: With armrests;From chair/3-in-1 Stand to Sit: 4: Min guard;With upper extremity assist;To toilet;To bed Details for Transfer Assistance: verbal cues for hand placement Ambulation/Gait Ambulation/Gait Assistance: 4: Min assist Ambulation Distance (Feet): 10 Feet Assistive device: Rolling walker Ambulation/Gait Assistance Details: verbal cues for safety with RW, step length. Gait Pattern: Step-to pattern;Antalgic;Decreased step length - right;Decreased stance time - right    Exercises     PT Diagnosis:    PT Problem List:   PT Treatment Interventions:     PT Goals (current goals can now be found in the care plan section)    Visit Information  Last PT Received On: 06/05/13 Assistance Needed: +1 History of Present Illness: [name] on 06/04/13    Subjective Data       Cognition       Balance     End of Session PT - End of Session Activity Tolerance: Patient limited by fatigue;Patient limited by pain Patient left: in bed;with call bell/phone within reach;with family/visitor present Nurse Communication: Mobility status   GP     [name] 06/05/2013, 4:50 PM

## 2013-06-05 NOTE — Evaluation (Signed)
Physical Therapy Evaluation Patient Details Name: [name] MRN: [id number] DOB: 08-27-1956 Today's Date: 06/05/2013 Time: 4098-1191 PT Time Calculation (min): 21 min  PT Assessment / Plan / Recommendation History of Present Illness  [name] on 06/04/13  Clinical Impression  Pt has had improved pain control. Pt will benefit from PT while in acute care to improve in functional mobility, safety with RW and for ROM/strength   PT Assessment  Patient needs continued PT services    Follow Up Recommendations  Home health PT    Does the patient have the potential to tolerate intense rehabilitation      Barriers to Discharge        Equipment Recommendations  None recommended by PT    Recommendations for Other Services     Frequency 7X/week    Precautions / Restrictions Precautions Precautions: Knee Required Braces or Orthoses: Knee Immobilizer - Right   Pertinent Vitals/Pain 3 R knee      Mobility  Bed Mobility Bed Mobility: Supine to Sit Supine to Sit: 4: Min assist Details for Bed Mobility Assistance: cues for hooking L foot under R to self support. Transfers Transfers: Sit to Stand Sit to Stand: 4: Min assist;With upper extremity assist;From bed Details for Transfer Assistance: verbal cues for hand placement and for safety, Pt reports she has to get up on R side due to stimulator implant Ambulation/Gait Ambulation/Gait Assistance: 4: Min assist Ambulation Distance (Feet): 100 Feet Assistive device: Rolling walker Ambulation/Gait Assistance Details: verbal cues for safe use of RW and for turns, sequencing. and step length on R and L Gait Pattern: Step-to pattern;Antalgic;Decreased step length - right;Decreased stance time - right    Exercises Total Joint Exercises Quad Sets: AROM;Right;10 reps;Seated   PT Diagnosis: Difficulty walking;Acute pain  PT Problem List: Decreased strength;Decreased range of motion;Decreased activity tolerance;Decreased mobility;Decreased  knowledge of use of DME;Decreased safety awareness;Decreased knowledge of precautions;Pain PT Treatment Interventions: DME instruction;Gait training;Functional mobility training;Therapeutic activities;Therapeutic exercise;Stair training;Patient/family education     PT Goals(Current goals can be found in the care plan section) Acute Rehab PT Goals Patient Stated Goal: To walk better, have no pain PT Goal Formulation: With patient/family Time For Goal Achievement: 06/08/13 Potential to Achieve Goals: Good  Visit Information  Last PT Received On: 06/05/13 Assistance Needed: +1 History of Present Illness: [name] on 06/04/13       Prior Functioning  Home Living Family/patient expects to be discharged to:: Private residence Living Arrangements: Spouse/significant other Available Help at Discharge: Family Type of Home: House Home Access: Stairs to enter Secretary/administrator of Steps: 1 Home Equipment: Environmental consultant - 2 wheels;Cane - single point;Tub bench Prior Function Level of Independence: Independent Communication Communication: No difficulties    Cognition  Cognition Arousal/Alertness: Awake/alert Behavior During Therapy: WFL for tasks assessed/performed Overall Cognitive Status: Within Functional Limits for tasks assessed    Extremity/Trunk Assessment Upper Extremity Assessment Upper Extremity Assessment: Overall WFL for tasks assessed Lower Extremity Assessment Lower Extremity Assessment: RLE deficits/detail RLE Deficits / Details: able to perform SLR   Balance    End of Session PT - End of Session Equipment Utilized During Treatment: Right knee immobilizer Activity Tolerance: Patient tolerated treatment well Patient left:  (with OT) Nurse Communication: Mobility status  GP     Rada Hay 06/05/2013, 12:02 PM  [facility]anch[facility] Kelch PT [phone]

## 2013-06-05 NOTE — Progress Notes (Signed)
Subjective: 1 Day Post-Op Procedure(s) (LRB): RIGHT KNEE MEDIAL UNICOMPARTMENTAL ARTHROPLASTY  (Right) Patient reports pain as moderate and severe.   Patient seen in rounds with Dr. Lequita Halt. Patient is well, but has had some minor complaints of pain in the knee, requiring pain medications We will start therapy today.  Plan is to go Home after hospital stay.  Objective: Vital signs in last 24 hours: Temp:  [97.2 F (36.2 C)-98.7 F (37.1 C)] 97.9 F (36.6 C) (09/09 0548) Pulse Rate:  [66-85] 69 (09/09 0700) Resp:  [11-20] 18 (09/09 0742) BP: (94-146)/(60-79) 101/63 mmHg (09/09 0700) SpO2:  [95 %-100 %] 96 % (09/09 0742) Weight:  [78.7 kg (173 lb 8 oz)] 78.7 kg (173 lb 8 oz) (09/08 1600)  Intake/Output from previous day:  Intake/Output Summary (Last 24 hours) at 06/05/13 0831 Last data filed at 06/05/13 0601  Gross per 24 hour  Intake 2511.25 ml  Output   2702 ml  Net -190.75 ml    Intake/Output this shift:    Labs:  Recent Labs  06/05/13 0400  HGB 10.6*    Recent Labs  06/05/13 0400  WBC 7.6  RBC 3.35*  HCT 31.3*  PLT 225    Recent Labs  06/05/13 0400  NA 136  K 4.5  CL 102  CO2 29  BUN 10  CREATININE 0.79  GLUCOSE 119*  CALCIUM 8.8   No results found for this basename: LABPT, INR,  in the last 72 hours  EXAM General - Patient is Alert, Appropriate and Oriented Extremity - Neurovascular intact Sensation intact distally Dorsiflexion/Plantar flexion intact Dressing - dressing C/D/I Motor Function - intact, moving foot and toes well on exam.  Hemovac pulled without difficulty.  Past Medical History  Diagnosis Date  . Acute medial meniscal injury of knee right  . Borderline diabetes   . Hypothyroidism   . GERD (gastroesophageal reflux disease)   . H/O hiatal hernia   . Facet joint disease of lumbosacral region   . Arthritis of knee bilateral   . Spinal cord stimulator status placement in 2010    left posterior flank  . Chronic back  pain   . Rapid palpitations controlled w/ metoprolol  . Normal cardiac stress test 5 yrs ago  . Normal echocardiogram 5 yrs ago  . PONV (postoperative nausea and vomiting)   . Hyperlipidemia   . Dysrhythmia     PALPITATIONS CONTROLLED WITH METOPROLOL  . Cluster headaches     TAKES NORVASC FOR HEADACHES  . Diabetes mellitus without complication   . Bronchitis 05-24-13    maybe once per year-denies asthma  . History of kidney stones     Assessment/Plan: 1 Day Post-Op Procedure(s) (LRB): RIGHT KNEE MEDIAL UNICOMPARTMENTAL ARTHROPLASTY  (Right) Principal Problem:   OA (osteoarthritis) of knee  Estimated body mass index is 29.62 kg/(m^2) as calculated from the following:   Height as of this encounter: 5' 4.17" (1.63 m).   Weight as of this encounter: 78.7 kg (173 lb 8 oz). Advance diet Up with therapy Discharge home with home health  DVT Prophylaxis - Xarelto for one week and then full dose ASA for two more weeks  Weight-Bearing as tolerated to right leg No vaccines. D/C O2 and Pulse OX and try on Room Air Possible home today after two sessions of therapy Diet - Cardiac diet and Diabetic diet Follow up - in 2 weeks Activity - WBAT Disposition - Home Condition Upon Discharge - Pending, Home if meets all therapy goals D/C  Meds - See DC Summary  PERKINS, ALEXZANDREW 06/05/2013, 8:31 AM

## 2013-06-06 LAB — BASIC METABOLIC PANEL
BUN: 9 mg/dL (ref 6–23)
Calcium: 8.8 mg/dL (ref 8.4–10.5)
GFR calc Af Amer: >90 mL/min (ref >90)
GFR calc non Af Amer: >90 mL/min (ref >90)
Potassium: 3.4 mEq/L — ABNORMAL LOW (ref 3.5–5.1)
Sodium: 137 mEq/L (ref 135–145)

## 2013-06-06 LAB — CBC
HCT: 30.6 % — ABNORMAL LOW (ref 36.0–46.0)
MCHC: 33.7 g/dL (ref 30.0–36.0)
RDW: 13.4 % (ref 11.5–15.5)

## 2013-06-06 LAB — GLUCOSE, CAPILLARY: Glucose-Capillary: 92 mg/dL (ref 70–99)

## 2013-06-06 MED ORDER — HYDROMORPHONE HCL 2 MG PO TABS: 2.0000 mg | ORAL_TABLET | ORAL | Status: DC | PRN

## 2013-06-06 MED ORDER — OXYCODONE HCL 5 MG PO TABS
5.0000 mg | ORAL_TABLET | ORAL | Status: DC | PRN
  Administered 2013-06-06: 10 mg via ORAL
  Filled 2013-06-06: qty 2

## 2013-06-06 MED ORDER — HYDROMORPHONE HCL 2 MG PO TABS
2.0000 mg | ORAL_TABLET | ORAL | Status: DC | PRN
  Administered 2013-06-06: 12:00:00 4 mg via ORAL
  Filled 2013-06-06: qty 2

## 2013-06-06 NOTE — Progress Notes (Signed)
   Subjective: 2 Days Post-Op Procedure(s) (LRB): RIGHT KNEE MEDIAL UNICOMPARTMENTAL ARTHROPLASTY  (Right) Patient reports pain as mild.   Patient seen in rounds with Dr. Lequita Halt. Patient is well, and has had no acute complaints or problems Patient is ready to go home  Objective: Vital signs in last 24 hours: Temp:  [97.9 F (36.6 C)-99 F (37.2 C)] 98.3 F (36.8 C) (09/10 0655) Pulse Rate:  [75-90] 90 (09/10 0655) Resp:  [16-20] 16 (09/10 0655) BP: (100-152)/(65-81) 152/81 mmHg (09/10 0655) SpO2:  [93 %-95 %] 95 % (09/10 0655)  Intake/Output from previous day:  Intake/Output Summary (Last 24 hours) at 06/06/13 1030 Last data filed at 06/06/13 0800  Gross per 24 hour  Intake    800 ml  Output   1100 ml  Net   -300 ml    Intake/Output this shift: Total I/O In: 120 [P.O.:120] Out: 300 [Urine:300]  Labs:  Recent Labs  06/05/13 0400 06/06/13 0405  HGB 10.6* 10.3*    Recent Labs  06/05/13 0400 06/06/13 0405  WBC 7.6 7.8  RBC 3.35* 3.27*  HCT 31.3* 30.6*  PLT 225 206    Recent Labs  06/05/13 0400 06/06/13 0405  NA 136 137  K 4.5 3.4*  CL 102 102  CO2 29 28  BUN 10 9  CREATININE 0.79 0.75  GLUCOSE 119* 116*  CALCIUM 8.8 8.8   No results found for this basename: LABPT, INR,  in the last 72 hours  EXAM: General - Patient is Alert, Appropriate and Oriented Extremity - Neurovascular intact Sensation intact distally Dorsiflexion/Plantar flexion intact Incision - clean, dry, no drainage, healing Motor Function - intact, moving foot and toes well on exam.   Assessment/Plan: 2 Days Post-Op Procedure(s) (LRB): RIGHT KNEE MEDIAL UNICOMPARTMENTAL ARTHROPLASTY  (Right) Procedure(s) (LRB): RIGHT KNEE MEDIAL UNICOMPARTMENTAL ARTHROPLASTY  (Right) Past Medical History  Diagnosis Date  . Acute medial meniscal injury of knee right  . Borderline diabetes   . Hypothyroidism   . GERD (gastroesophageal reflux disease)   . H/O hiatal hernia   . Facet joint  disease of lumbosacral region   . Arthritis of knee bilateral   . Spinal cord stimulator status placement in 2010    left posterior flank  . Chronic back pain   . Rapid palpitations controlled w/ metoprolol  . Normal cardiac stress test 5 yrs ago  . Normal echocardiogram 5 yrs ago  . PONV (postoperative nausea and vomiting)   . Hyperlipidemia   . Dysrhythmia     PALPITATIONS CONTROLLED WITH METOPROLOL  . Cluster headaches     TAKES NORVASC FOR HEADACHES  . Diabetes mellitus without complication   . Bronchitis 05-24-13    maybe once per year-denies asthma  . History of kidney stones    Principal Problem:   OA (osteoarthritis) of knee  Estimated body mass index is 29.62 kg/(m^2) as calculated from the following:   Height as of this encounter: 5' 4.17" (1.63 m).   Weight as of this encounter: 78.7 kg (173 lb 8 oz). Up with therapy Discharge home with home health Diet - Cardiac diet and Diabetic diet Follow up - in 2 weeks Activity - WBAT Disposition - Home Condition Upon Discharge - Good D/C Meds - See DC Summary DVT Prophylaxis - Xarelto for one week and then full dose ASA for two more weeks   Felice Deem 06/06/2013, 10:30 AM

## 2013-06-06 NOTE — Progress Notes (Signed)
Physical Therapy Treatment Patient Details Name: [name] MRN: [id number] DOB: July 08, 1956 Today's Date: 06/06/2013 Time: 4098-1191 PT Time Calculation (min): 27 min  PT Assessment / Plan / Recommendation  History of Present Illness [name] on 06/04/13   PT Comments   Pt tolerated activity well/  Follow Up Recommendations  Home health PT     Does the patient have the potential to tolerate intense rehabilitation     Barriers to Discharge        Equipment Recommendations  None recommended by PT    Recommendations for Other Services    Frequency     Progress towards PT Goals    Plan Current plan remains appropriate    Precautions / Restrictions Precautions Precautions: Knee Required Braces or Orthoses: Knee Immobilizer - Right   Pertinent Vitals/Pain R knee is 4 , just premedicated.    Mobility  Bed Mobility Bed Mobility: Supine to Sit Supine to Sit: 5: Supervision Sit to Supine: 4: Min guard Details for Bed Mobility Assistance: cues for hooking L foot under R to self support while placing fot onto bed. Transfers Sit to Stand: 5: Supervision;From bed;From elevated surface Stand to Sit: To bed;With upper extremity assist;To elevated surface Details for Transfer Assistance: verbal cues for hand placement Ambulation/Gait Ambulation/Gait Assistance: 5: Supervision Ambulation Distance (Feet): 100 Feet Assistive device: Rolling walker Ambulation/Gait Assistance Details: cues for sequence and posture. Stairs: Yes Stairs Assistance: 4: Min guard Stair Management Technique: No rails;Backwards;Forwards;With walker Number of Stairs: 1    Exercises Total Joint Exercises Quad Sets: AROM;Right;10 reps;Supine Short Arc Quad: AAROM;Right;10 reps;Supine Heel Slides: AAROM;Right;10 reps;Supine Straight Leg Raises: AAROM;Right;10 reps;Supine Goniometric ROM: 10-45 R knee   PT Diagnosis:    PT Problem List:   PT Treatment Interventions:     PT Goals (current goals can now  be found in the care plan section)    Visit Information  Last PT Received On: 06/06/13 Assistance Needed: +1 History of Present Illness: [name] on 06/04/13    Subjective Data      Cognition  Cognition Arousal/Alertness: Awake/alert    Balance     End of Session PT - End of Session Activity Tolerance: Patient tolerated treatment well Patient left: in bed;with call bell/phone within reach;with family/visitor present Nurse Communication: Mobility status   GP     Rada Hay 06/06/2013, 2:52 PM [facility]anch[facility] Kelch PT [phone]

## 2013-06-06 NOTE — Discharge Summary (Signed)
Physician Discharge Summary   Patient ID: [name] MRN: [id number] DOB/AGE: 05-13-1956 57 y.o.  Admit date: 06/04/2013 Discharge date: 06/06/2013   Primary Diagnosis:  Right knee medial unicompartmental arthritis.  Admission Diagnoses:  Past Medical History  Diagnosis Date  . Acute medial meniscal injury of knee right  . Borderline diabetes   . Hypothyroidism   . GERD (gastroesophageal reflux disease)   . H/O hiatal hernia   . Facet joint disease of lumbosacral region   . Arthritis of knee bilateral   . Spinal cord stimulator status placement in 2010    left posterior flank  . Chronic back pain   . Rapid palpitations controlled w/ metoprolol  . Normal cardiac stress test 5 yrs ago  . Normal echocardiogram 5 yrs ago  . PONV (postoperative nausea and vomiting)   . Hyperlipidemia   . Dysrhythmia     PALPITATIONS CONTROLLED WITH METOPROLOL  . Cluster headaches     TAKES NORVASC FOR HEADACHES  . Diabetes mellitus without complication   . Bronchitis 05-24-13    maybe once per year-denies asthma  . History of kidney stones    Discharge Diagnoses:   Principal Problem:   OA (osteoarthritis) of knee  Estimated body mass index is 29.62 kg/(m^2) as calculated from the following:   Height as of this encounter: 5' 4.17" (1.63 m).   Weight as of this encounter: 78.7 kg (173 lb 8 oz).  Procedure:  Procedure(s) (LRB): RIGHT KNEE MEDIAL UNICOMPARTMENTAL ARTHROPLASTY  (Right)   Consults: None  HPI: [name] is a 57 year old female who has  significant medial compartment osteoarthritis of her right knee. She  has had previous arthroscopy, which show the arthritis is isolated to  the medial compartment. She has failed nonoperative management  including arthroscopy, cortisone, and Visco supplement injections, and  presents now for medial unicompartmental arthroplasty.  Laboratory Data: Admission on 06/04/2013  Component Date Value Range Status  . ABO/RH(D) 06/04/2013 O NEG    Final  . Antibody Screen 06/04/2013 NEG   Final  . Sample Expiration 06/04/2013 06/07/2013   Final  . Glucose-Capillary 06/04/2013 103* 70 - 99 mg/dL Final  . ABO/RH(D) 78/46/9629 O NEG   Final  . Glucose-Capillary 06/04/2013 127* 70 - 99 mg/dL Final  . WBC 52/84/1324 7.6  4.0 - 10.5 K/uL Final  . RBC 06/05/2013 3.35* 3.87 - 5.11 MIL/uL Final  . Hemoglobin 06/05/2013 10.6* 12.0 - 15.0 g/dL Final  . HCT 40/06/2724 31.3* 36.0 - 46.0 % Final  . MCV 06/05/2013 93.4  78.0 - 100.0 fL Final  . MCH 06/05/2013 31.6  26.0 - 34.0 pg Final  . MCHC 06/05/2013 33.9  30.0 - 36.0 g/dL Final  . RDW 36/64/4034 13.1  11.5 - 15.5 % Final  . Platelets 06/05/2013 225  150 - 400 K/uL Final  . Sodium 06/05/2013 136  135 - 145 mEq/L Final  . Potassium 06/05/2013 4.5  3.5 - 5.1 mEq/L Final  . Chloride 06/05/2013 102  96 - 112 mEq/L Final  . CO2 06/05/2013 29  19 - 32 mEq/L Final  . Glucose, Bld 06/05/2013 119* 70 - 99 mg/dL Final  . BUN 74/25/9563 10  6 - 23 mg/dL Final  . Creatinine, Ser 06/05/2013 0.79  0.50 - 1.10 mg/dL Final  . Calcium 87/56/4332 8.8  8.4 - 10.5 mg/dL Final  . GFR calc non Af Amer 06/05/2013 >90  >90 mL/min Final  . GFR calc Af Amer 06/05/2013 >90  >90 mL/min Final   Comment: (NOTE)  The eGFR has been calculated using the CKD EPI equation.                          This calculation has not been validated in all clinical situations.                          eGFR's persistently <90 mL/min signify possible Chronic Kidney                          Disease.  . Glucose-Capillary 06/04/2013 174* 70 - 99 mg/dL Final  . Glucose-Capillary 06/04/2013 159* 70 - 99 mg/dL Final  . Comment 1 40/98/1191 Notify RN   Final  . Glucose-Capillary 06/05/2013 83  70 - 99 mg/dL Final  . Glucose-Capillary 06/05/2013 118* 70 - 99 mg/dL Final  . WBC 47/82/9562 7.8  4.0 - 10.5 K/uL Final  . RBC 06/06/2013 3.27* 3.87 - 5.11 MIL/uL Final  . Hemoglobin 06/06/2013 10.3* 12.0 - 15.0 g/dL Final    . HCT 13/04/6577 30.6* 36.0 - 46.0 % Final  . MCV 06/06/2013 93.6  78.0 - 100.0 fL Final  . MCH 06/06/2013 31.5  26.0 - 34.0 pg Final  . MCHC 06/06/2013 33.7  30.0 - 36.0 g/dL Final  . RDW 46/96/2952 13.4  11.5 - 15.5 % Final  . Platelets 06/06/2013 206  150 - 400 K/uL Final  . Sodium 06/06/2013 137  135 - 145 mEq/L Final  . Potassium 06/06/2013 3.4* 3.5 - 5.1 mEq/L Final   Comment: RESULT REPEATED AND VERIFIED                          DELTA CHECK NOTED  . Chloride 06/06/2013 102  96 - 112 mEq/L Final  . CO2 06/06/2013 28  19 - 32 mEq/L Final  . Glucose, Bld 06/06/2013 116* 70 - 99 mg/dL Final  . BUN 84/13/2440 9  6 - 23 mg/dL Final  . Creatinine, Ser 06/06/2013 0.75  0.50 - 1.10 mg/dL Final  . Calcium 07/24/2535 8.8  8.4 - 10.5 mg/dL Final  . GFR calc non Af Amer 06/06/2013 >90  >90 mL/min Final  . GFR calc Af Amer 06/06/2013 >90  >90 mL/min Final   Comment: (NOTE)                          The eGFR has been calculated using the CKD EPI equation.                          This calculation has not been validated in all clinical situations.                          eGFR's persistently <90 mL/min signify possible Chronic Kidney                          Disease.  . Glucose-Capillary 06/05/2013 152* 70 - 99 mg/dL Final  . Glucose-Capillary 06/05/2013 116* 70 - 99 mg/dL Final  . Comment 1 64/40/3474 Notify RN   Final  . Glucose-Capillary 06/06/2013 114* 70 - 99 mg/dL Final  Hospital Outpatient Visit on 05/24/2013  Component Date Value Range Status  . MRSA, PCR 05/24/2013 NEGATIVE  NEGATIVE Final  . Staphylococcus aureus 05/24/2013  POSITIVE* NEGATIVE Final   Comment:                                 The Xpert SA Assay (FDA                          approved for NASAL specimens                          in patients over 59 years of age),                          is one component of                          a comprehensive surveillance                          program.  Test performance has                           been validated by [employer] for patients greater                          than or equal to 53 year old.                          It is not intended                          to diagnose infection nor to                          guide or monitor treatment.  Marland Kitchen aPTT 05/24/2013 32  24 - 37 seconds Final  . WBC 05/24/2013 6.8  4.0 - 10.5 K/uL Final  . RBC 05/24/2013 3.73* 3.87 - 5.11 MIL/uL Final  . Hemoglobin 05/24/2013 11.5* 12.0 - 15.0 g/dL Final  . HCT 16/06/9603 34.9* 36.0 - 46.0 % Final  . MCV 05/24/2013 93.6  78.0 - 100.0 fL Final  . MCH 05/24/2013 30.8  26.0 - 34.0 pg Final  . MCHC 05/24/2013 33.0  30.0 - 36.0 g/dL Final  . RDW 54/05/8118 13.4  11.5 - 15.5 % Final  . Platelets 05/24/2013 231  150 - 400 K/uL Final  . Sodium 05/24/2013 137  135 - 145 mEq/L Final  . Potassium 05/24/2013 4.0  3.5 - 5.1 mEq/L Final  . Chloride 05/24/2013 100  96 - 112 mEq/L Final  . CO2 05/24/2013 27  19 - 32 mEq/L Final  . Glucose, Bld 05/24/2013 148* 70 - 99 mg/dL Final  . BUN 14/78/2956 13  6 - 23 mg/dL Final  . Creatinine, Ser 05/24/2013 0.94  0.50 - 1.10 mg/dL Final  . Calcium 21/30/8657 9.1  8.4 - 10.5 mg/dL Final  . Total Protein 05/24/2013 6.7  6.0 - 8.3 g/dL Final  . Albumin 84/69/6295 3.7  3.5 - 5.2 g/dL Final  . AST 28/41/3244 37  0 - 37 U/L Final  .  ALT 05/24/2013 45* 0 - 35 U/L Final  . Alkaline Phosphatase 05/24/2013 60  39 - 117 U/L Final  . Total Bilirubin 05/24/2013 0.2* 0.3 - 1.2 mg/dL Final  . GFR calc non Af Amer 05/24/2013 67* >90 mL/min Final  . GFR calc Af Amer 05/24/2013 78* >90 mL/min Final   Comment: (NOTE)                          The eGFR has been calculated using the CKD EPI equation.                          This calculation has not been validated in all clinical situations.                          eGFR's persistently <90 mL/min signify possible Chronic Kidney                          Disease.  Marland Kitchen Prothrombin Time  05/24/2013 13.1  11.6 - 15.2 seconds Final  . INR 05/24/2013 1.01  0.00 - 1.49 Final  . Color, Urine 05/24/2013 YELLOW  YELLOW Final  . APPearance 05/24/2013 CLOUDY* CLEAR Final  . Specific Gravity, Urine 05/24/2013 1.022  1.005 - 1.030 Final  . pH 05/24/2013 5.5  5.0 - 8.0 Final  . Glucose, UA 05/24/2013 NEGATIVE  NEGATIVE mg/dL Final  . Hgb urine dipstick 05/24/2013 NEGATIVE  NEGATIVE Final  . Bilirubin Urine 05/24/2013 NEGATIVE  NEGATIVE Final  . Ketones, ur 05/24/2013 NEGATIVE  NEGATIVE mg/dL Final  . Protein, ur 78/29/5621 NEGATIVE  NEGATIVE mg/dL Final  . Urobilinogen, UA 05/24/2013 0.2  0.0 - 1.0 mg/dL Final  . Nitrite 30/86/5784 NEGATIVE  NEGATIVE Final  . Leukocytes, UA 05/24/2013 MODERATE* NEGATIVE Final  . Squamous Epithelial / LPF 05/24/2013 FEW* RARE Final  . WBC, UA 05/24/2013 0-2  <3 WBC/hpf Final  . RBC / HPF 05/24/2013 0-2  <3 RBC/hpf Final  . Urine-Other 05/24/2013 MUCOUS PRESENT   Final     X-Rays:No results found.  EKG: Orders placed in visit on 05/24/13  . EKG 12-LEAD     Hospital Course: Dakia Schifano is a 57 y.o. who was admitted to [facility]. They were brought to the operating room on 06/04/2013 and underwent Procedure(s): RIGHT KNEE MEDIAL UNICOMPARTMENTAL ARTHROPLASTY .  Patient tolerated the procedure well and was later transferred to the recovery room and then to the orthopaedic floor for postoperative care.  They were given PO and IV analgesics for pain control following their surgery.  They were given 24 hours of postoperative antibiotics of  Anti-infectives   Start     Dose/Rate Route Frequency Ordered Stop   06/05/13 2300  vancomycin (VANCOCIN) IVPB 1000 mg/200 mL premix     1,000 mg 200 mL/hr over 60 Minutes Intravenous Every 12 hours 06/04/13 1600 06/06/13 2259   06/04/13 0952  vancomycin (VANCOCIN) 1,500 mg in sodium chloride 0.9 % 500 mL IVPB     1,500 mg 250 mL/hr over 120 Minutes Intravenous On call to O.R. 06/04/13 6962 06/04/13  1246     and started on DVT prophylaxis in the form of Xarelto.   PT and OT were ordered for total joint protocol.  Discharge planning consulted to help with postop disposition and equipment needs.  Patient had a decent night on the evening of surgery.  They started to get up OOB with therapy on day one. Hemovac drain was pulled without difficulty.  Continued to work with therapy into day two.  Dressing was changed on day two and the incision was healing well.  Patient was seen in rounds and was ready to go home later that day.   Discharge Medications: Prior to Admission medications   Medication Sig Start Date End Date Taking? Authorizing Provider  ALPRAZolam Prudy Feeler) 0.5 MG tablet Take 0.5 mg by mouth 2 (two) times daily.   Yes Historical Provider, MD  amLODipine (NORVASC) 2.5 MG tablet Take 2.5 mg by mouth daily before breakfast. Pt takes this med. For cluster headache's   Yes Historical Provider, MD  docusate sodium (COLACE) 100 MG capsule Take 100 mg by mouth 2 (two) times daily.   Yes Historical Provider, MD  esomeprazole (NEXIUM) 40 MG capsule Take 40 mg by mouth daily before supper.    Yes Historical Provider, MD  glimepiride (AMARYL) 2 MG tablet Take 2 mg by mouth daily before breakfast.   Yes Historical Provider, MD  levothyroxine (SYNTHROID, LEVOTHROID) 50 MCG tablet Take 50 mcg by mouth daily before breakfast.    Yes Historical Provider, MD  metFORMIN (GLUCOPHAGE) 1000 MG tablet Take 1,000 mg by mouth 2 (two) times daily with a meal.   Yes Historical Provider, MD  metoprolol (LOPRESSOR) 50 MG tablet Take 50 mg by mouth 2 (two) times daily. Pt takes this med. For palpitations/ tachycardia   Yes Historical Provider, MD  nortriptyline (PAMELOR) 50 MG capsule Take 100 mg by mouth at bedtime.   Yes Historical Provider, MD  simvastatin (ZOCOR) 40 MG tablet Take 20 mg by mouth every evening. Takes 1/2 tablet   Yes Historical Provider, MD  traMADol (ULTRAM) 50 MG tablet Take 50 mg by mouth every  6 (six) hours as needed. Pain   Yes Historical Provider, MD  HYDROmorphone (DILAUDID) 2 MG tablet Take 1-2 tablets (2-4 mg total) by mouth every 3 (three) hours as needed. 06/05/13   Alexzandrew Perkins, PA-C  methocarbamol (ROBAXIN) 500 MG tablet Take 1 tablet (500 mg total) by mouth every 6 (six) hours as needed. 06/05/13   Alexzandrew Julien Girt, PA-C  rivaroxaban (XARELTO) 10 MG TABS tablet Take 1 tablet (10 mg total) by mouth daily with breakfast. Take Xarelto for one week, then discontinue Xarelto. 06/05/13   Alexzandrew Julien Girt, PA-C   Discharge home with home health  Diet - Cardiac diet and Diabetic diet  Follow up - in 2 weeks  Activity - WBAT  Disposition - Home  Condition Upon Discharge - Good  D/C Meds - See DC Summary  DVT Prophylaxis - Xarelto for one week and then full dose ASA for two more weeks   Discharge Orders   Future Orders Complete By Expires   Call MD / Call 911  As directed    Comments:     If you experience chest pain or shortness of breath, CALL 911 and be transported to the hospital emergency room.  If you develope a fever above 101 F, pus (white drainage) or increased drainage or redness at the wound, or calf pain, call your surgeon's office.   Call MD / Call 911  As directed    Comments:     If you experience chest pain or shortness of breath, CALL 911 and be transported to the hospital emergency room.  If you develope a fever above 101 F, pus (white drainage) or increased drainage or redness at the wound,  or calf pain, call your surgeon's office.   Change dressing  As directed    Comments:     Change dressing daily with sterile 4 x 4 inch gauze dressing and apply TED hose. Do not submerge the incision under water.   Change dressing  As directed    Comments:     Change dressing daily with sterile 4 x 4 inch gauze dressing and apply TED hose. Do not submerge the incision under water.   Constipation Prevention  As directed    Comments:     Drink plenty of fluids.   Prune juice may be helpful.  You may use a stool softener, such as Colace (over the counter) 100 mg twice a day.  Use MiraLax (over the counter) for constipation as needed.   Constipation Prevention  As directed    Comments:     Drink plenty of fluids.  Prune juice may be helpful.  You may use a stool softener, such as Colace (over the counter) 100 mg twice a day.  Use MiraLax (over the counter) for constipation as needed.   Diet - low sodium heart healthy  As directed    Diet - low sodium heart healthy  As directed    Discharge instructions  As directed    Comments:     Pick up stool softner and laxative for home. Do not submerge incision under water. May shower. Continue to use ice for pain and swelling from surgery.  Take Xarelto for one week, then discontinue Xarelto. After completing the week of Xarelto, take a full dose 325 mg Aspirin daily for two weeks, and then reduce to a baby 81 mg aspirin daily.   Discharge instructions  As directed    Comments:     Pick up stool softner and laxative for home. Do not submerge incision under water. May shower. Continue to use ice for pain and swelling from surgery.  Take Xarelto for one week, and then resume 325 mg Aspirin.   Do not put a pillow under the knee. Place it under the heel.  As directed    Do not put a pillow under the knee. Place it under the heel.  As directed    Do not sit on low chairs, stoools or toilet seats, as it may be difficult to get up from low surfaces  As directed    Do not sit on low chairs, stoools or toilet seats, as it may be difficult to get up from low surfaces  As directed    Driving restrictions  As directed    Comments:     No driving until released by the physician.   Driving restrictions  As directed    Comments:     No driving until released by the physician.   Increase activity slowly as tolerated  As directed    Increase activity slowly as tolerated  As directed    Lifting restrictions  As directed     Comments:     No lifting until released by the physician.   Lifting restrictions  As directed    Comments:     No lifting until released by the physician.   Patient may shower  As directed    Comments:     You may shower without a dressing once there is no drainage.  Do not wash over the wound.  If drainage remains, do not shower until drainage stops.   Patient may shower  As directed  Comments:     You may shower without a dressing once there is no drainage.  Do not wash over the wound.  If drainage remains, do not shower until drainage stops.   TED hose  As directed    Comments:     Use stockings (TED hose) for 3 weeks on both leg(s).  You may remove them at night for sleeping.   TED hose  As directed    Comments:     Use stockings (TED hose) for 3 weeks on both leg(s).  You may remove them at night for sleeping.   Weight bearing as tolerated  As directed    Weight bearing as tolerated  As directed        Medication List    STOP taking these medications       CALCIUM 600/VITAMIN D PO     diclofenac 1.3 % Ptch  Commonly known as:  FLECTOR     diclofenac sodium 1 % Gel  Commonly known as:  VOLTAREN     fish oil-omega-3 fatty acids 1000 MG capsule     HYDROcodone-acetaminophen 5-325 MG per tablet  Commonly known as:  NORCO/VICODIN     lidocaine 5 %  Commonly known as:  LIDODERM     meloxicam 15 MG tablet  Commonly known as:  MOBIC     multivitamin with minerals Tabs tablet     Vitamin D (Ergocalciferol) 50000 UNITS Caps capsule  Commonly known as:  DRISDOL      TAKE these medications       ALPRAZolam 0.5 MG tablet  Commonly known as:  XANAX  Take 0.5 mg by mouth 2 (two) times daily.     amLODipine 2.5 MG tablet  Commonly known as:  NORVASC  Take 2.5 mg by mouth daily before breakfast. Pt takes this med. For cluster headache's     docusate sodium 100 MG capsule  Commonly known as:  COLACE  Take 100 mg by mouth 2 (two) times daily.     esomeprazole  40 MG capsule  Commonly known as:  NEXIUM  Take 40 mg by mouth daily before supper.     glimepiride 2 MG tablet  Commonly known as:  AMARYL  Take 2 mg by mouth daily before breakfast.     HYDROmorphone 2 MG tablet  Commonly known as:  DILAUDID  Take 1-2 tablets (2-4 mg total) by mouth every 3 (three) hours as needed.     levothyroxine 50 MCG tablet  Commonly known as:  SYNTHROID, LEVOTHROID  Take 50 mcg by mouth daily before breakfast.     metFORMIN 1000 MG tablet  Commonly known as:  GLUCOPHAGE  Take 1,000 mg by mouth 2 (two) times daily with a meal.     methocarbamol 500 MG tablet  Commonly known as:  ROBAXIN  Take 1 tablet (500 mg total) by mouth every 6 (six) hours as needed.     metoprolol 50 MG tablet  Commonly known as:  LOPRESSOR  Take 50 mg by mouth 2 (two) times daily. Pt takes this med. For palpitations/ tachycardia     nortriptyline 50 MG capsule  Commonly known as:  PAMELOR  Take 100 mg by mouth at bedtime.     rivaroxaban 10 MG Tabs tablet  Commonly known as:  XARELTO  Take 1 tablet (10 mg total) by mouth daily with breakfast. Take Xarelto for one week, then discontinue Xarelto.     simvastatin 40 MG tablet  Commonly known as:  ZOCOR  Take 20 mg by mouth every evening. Takes 1/2 tablet     ULTRAM 50 MG tablet  Generic drug:  traMADol  Take 50 mg by mouth every 6 (six) hours as needed. Pain           Follow-up Information   Follow up with [name], MD. Schedule an appointment as soon as possible for a visit in 2 weeks.   Specialty:  Orthopedic Surgery   Contact information:   [address] [place] [address] [phone]       Signed: Patrica Duel 06/06/2013, 10:35 AM

## 2013-10-14 IMAGING — CT CT ANKLE*R* W/O CM
3 of 5 series · 6 of 14 positions shown, 7 images · IV contrast (agent unspecified)
Comparison: None.

CLINICAL DATA: Preop for knee resurfacing implant placement.

[employer] protocol.
CT OF THE RIGHT HIP WITHOUT CONTRAST,CT OF THE RIGHT ANKLE WITH
CONTRAST,CT OF THE RIGHT KNEE WITHOUT CONTRAST
TECHNIQUE: Multidetector CT imaging was performed according to the
[employer] protocol. Multiplanar CT image reconstructions were also
generated.,

[Series 5: rt knee soft · axial · 0.39mm/px · z∈[-556,-462]mm · 2 of 114 slices shown]
[im 38/114  soft-tissue]
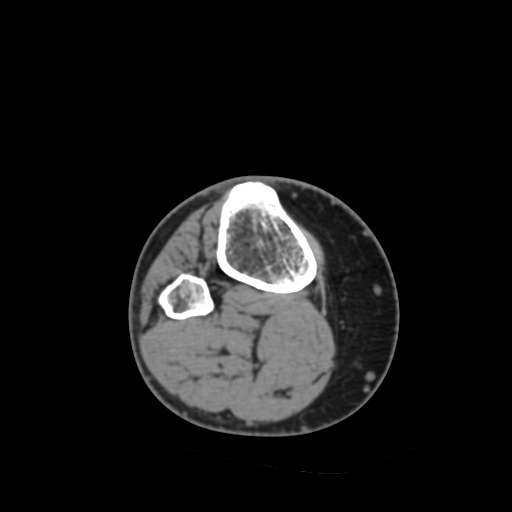
[im 76/114  soft-tissue]
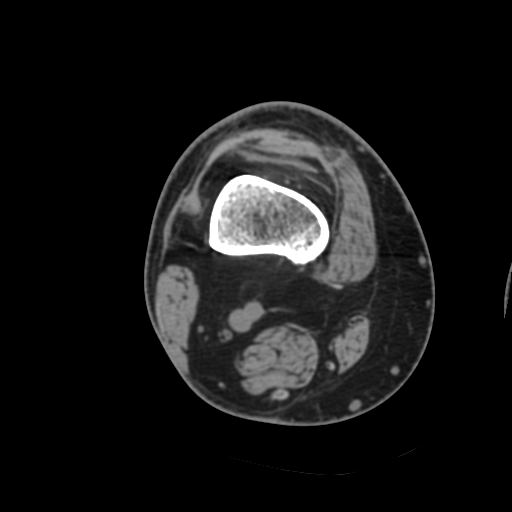

[Series 400: cor right knee · coronal · 0.57mm/px · 2 of 105 slices shown, 3 images]
[im 35/105  soft-tissue]
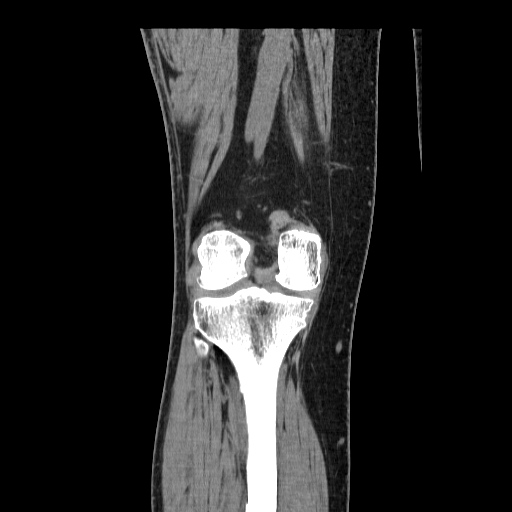
[im 35/105  bone]
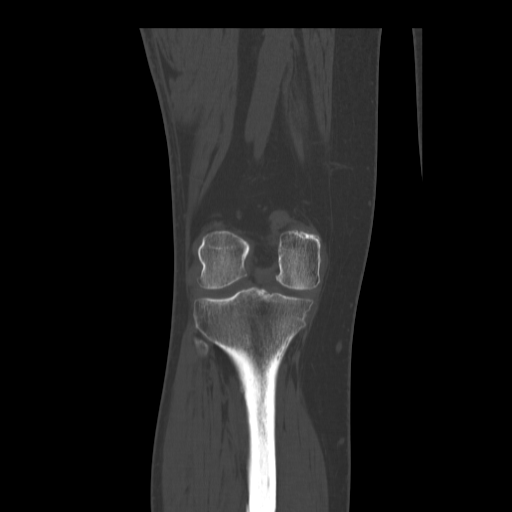
[im 70/105  bone]
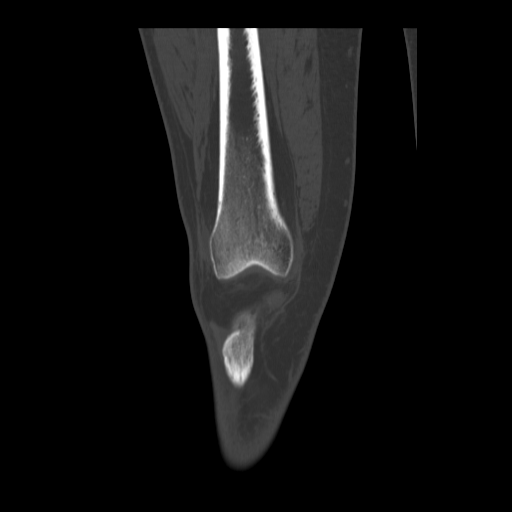

[Series 401: sag right knee · sagittal · 0.57mm/px · 2 of 101 slices shown]
[im 34/101  bone]
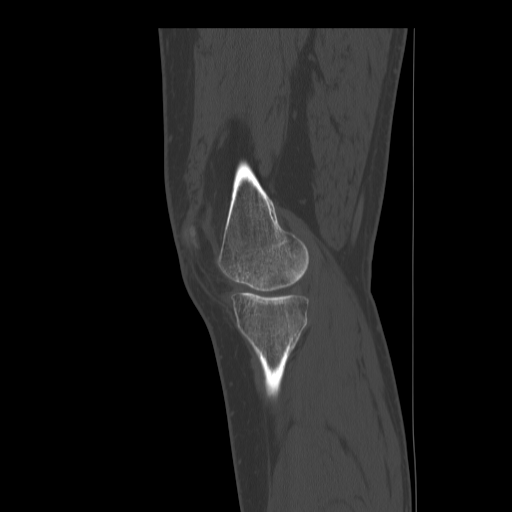
[im 67/101  bone]
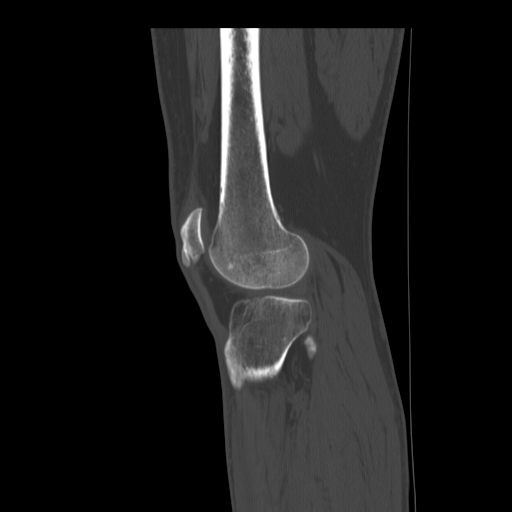

[6 of 14 positions shown; findings below may reference images not displayed]

FINDINGS: Knee:

Medial compartment:

Moderate medial compartment degenerative changes with joint space
narrowing and mild osteophytic spurring.  There is also mild bony
eburnation.  No definite subchondral cystic changes or
osteochondral lesion.

Lateral compartment:

Minimal degenerative change.

Patellofemoral compartment:

Mild degenerative change with spurring and subchondral cyst.  The
lateral patellofemoral joint is mildly narrowed.

Other findings:

Small amount of fluid in the joint but no overt joint effusion.
Mild enthesopathic changes involving the patella both superiorly
and inferiorly.

Hip:

Moderate to advanced right hip joint degenerative changes with
joint space narrowing, osteophytic spurring and subchondral cystic
change.

Ankle:

No significant degenerative changes or acute bony findings.
IMPRESSION: 1.  Moderate degenerative changes involving the knee joint mainly
in the medial compartment.

2.  Moderate to advanced right hip joint degenerative changes.

## 2014-04-08 NOTE — Progress Notes (Signed)
Kristen Peacerew Perkins, PA  -  Please enter preop orders in Epic for [name] - [facility].  Thanks.

## 2014-04-10 ENCOUNTER — Encounter (HOSPITAL_COMMUNITY): Payer: Self-pay | Admitting: Pharmacy Technician

## 2014-04-11 ENCOUNTER — Other Ambulatory Visit: Payer: Self-pay | Admitting: Orthopedic Surgery

## 2014-04-22 NOTE — Patient Instructions (Signed)
Kristen LeafDeborah Henson  04/22/2014   Your procedure is scheduled on:  05/01/14  1045am-1230pm  [facility].  Follow the Signs to Short Stay Center at   0815     am  Call this number if you have problems the morning of surgery: [phone]   Remember:   Do not eat food or drink liquids after midnight.   Take these medicines the morning of surgery with A SIP OF WATER:    Do not wear jewelry, make-up or nail polish.  Do not wear lotions, powders, or perfumes. , deodorant  Do not shave 48 hours prior to surgery.   Do not bring valuables to the hospital.  Contacts, dentures or bridgework may not be worn into surgery.  Leave suitcase in the car. After surgery it may be brought to your room.  For patients admitted to the hospital, checkout time is 11:00 AM the day of  discharge.       [facility] - Preparing for Surgery Before surgery, you can play an important role.  Because skin is not sterile, your skin needs to be as free of germs as possible.  You can reduce the number of germs on your skin by washing with CHG (chlorahexidine gluconate) soap before surgery.  CHG is an antiseptic cleaner which kills germs and bonds with the skin to continue killing germs even after washing. Please DO NOT use if you have an allergy to CHG or antibacterial soaps.  If your skin becomes reddened/irritated stop using the CHG and inform your nurse when you arrive at Short Stay. Do not shave (including legs and underarms) for at least 48 hours prior to the first CHG shower.  You may shave your face/neck. Please follow these instructions carefully:  1.  Shower with CHG Soap the night before surgery and the  morning of Surgery.  2.  If you choose to wash your hair, wash your hair first as usual with your  normal  shampoo.  3.  After you shampoo, rinse your hair and body thoroughly to remove the  shampoo.                           4.  Use CHG as you would any other liquid soap.  You can apply chg directly   to the skin and wash                       Gently with a scrungie or clean washcloth.  5.  Apply the CHG Soap to your body ONLY FROM THE NECK DOWN.   Do not use on face/ open                           Wound or open sores. Avoid contact with eyes, ears mouth and genitals (private parts).                       Wash face,  Genitals (private parts) with your normal soap.             6.  Wash thoroughly, paying special attention to the area where your surgery  will be performed.  7.  Thoroughly rinse your body with warm water from the neck down.  8.  DO NOT shower/wash with your normal soap after using and rinsing off  the CHG Soap.  9.  Pat yourself dry with a clean towel.            10.  Wear clean pajamas.            11.  Place clean sheets on your bed the night of your first shower and do not  sleep with pets. Day of Surgery : Do not apply any lotions/deodorants the morning of surgery.  Please wear clean clothes to the hospital/surgery center.  FAILURE TO FOLLOW THESE INSTRUCTIONS MAY RESULT IN THE CANCELLATION OF YOUR SURGERY PATIENT SIGNATURE_________________________________  NURSE SIGNATURE__________________________________  ________________________________________________________________________  WHAT IS A BLOOD TRANSFUSION? Blood Transfusion Information  A transfusion is the replacement of blood or some of its parts. Blood is made up of multiple cells which provide different functions.  Red blood cells carry oxygen and are used for blood loss replacement.  White blood cells fight against infection.  Platelets control bleeding.  Plasma helps clot blood.  Other blood products are available for specialized needs, such as hemophilia or other clotting disorders. BEFORE THE TRANSFUSION  Who gives blood for transfusions?   Healthy volunteers who are fully evaluated to make sure their blood is safe. This is blood bank blood. Transfusion therapy is the safest it has  ever been in the practice of medicine. Before blood is taken from a donor, a complete history is taken to make sure that person has no history of diseases nor engages in risky social behavior (examples are intravenous drug use or sexual activity with multiple partners). The donor's travel history is screened to minimize risk of transmitting infections, such as malaria. The donated blood is tested for signs of infectious diseases, such as HIV and hepatitis. The blood is then tested to be sure it is compatible with you in order to minimize the chance of a transfusion reaction. If you or a relative donates blood, this is often done in anticipation of surgery and is not appropriate for emergency situations. It takes many days to process the donated blood. RISKS AND COMPLICATIONS Although transfusion therapy is very safe and saves many lives, the main dangers of transfusion include:   Getting an infectious disease.  Developing a transfusion reaction. This is an allergic reaction to something in the blood you were given. Every precaution is taken to prevent this. The decision to have a blood transfusion has been considered carefully by your caregiver before blood is given. Blood is not given unless the benefits outweigh the risks. AFTER THE TRANSFUSION  Right after receiving a blood transfusion, you will usually feel much better and more energetic. This is especially true if your red blood cells have gotten low (anemic). The transfusion raises the level of the red blood cells which carry oxygen, and this usually causes an energy increase.  The nurse administering the transfusion will monitor you carefully for complications. HOME CARE INSTRUCTIONS  No special instructions are needed after a transfusion. You may find your energy is better. Speak with your caregiver about any limitations on activity for underlying diseases you may have. SEEK MEDICAL CARE IF:   Your condition is not improving after your  transfusion.  You develop redness or irritation at the intravenous (IV) site. SEEK IMMEDIATE MEDICAL CARE IF:  Any of the following symptoms occur over the next 12 hours:  Shaking chills.  You have a temperature by mouth above 102 F (38.9 C), not controlled by medicine.  Chest, back, or muscle pain.  People around you feel you are not acting correctly or  are confused.  Shortness of breath or difficulty breathing.  Dizziness and fainting.  You get a rash or develop hives.  You have a decrease in urine output.  Your urine turns a dark color or changes to pink, red, or brown. Any of the following symptoms occur over the next 10 days:  You have a temperature by mouth above 102 F (38.9 C), not controlled by medicine.  Shortness of breath.  Weakness after normal activity.  The white part of the eye turns yellow (jaundice).  You have a decrease in the amount of urine or are urinating less often.  Your urine turns a dark color or changes to pink, red, or brown. Document Released: 09/10/2000 Document Revised: 12/06/2011 Document Reviewed: 04/29/2008 ExitCare Patient Information 2014 [place].  _______________________________________________________________________  Incentive Spirometer  An incentive spirometer is a tool that can help keep your lungs clear and active. This tool measures how well you are filling your lungs with each breath. Taking long deep breaths may help reverse or decrease the chance of developing breathing (pulmonary) problems (especially infection) following:  A long period of time when you are unable to move or be active. BEFORE THE PROCEDURE   If the spirometer includes an indicator to show your best effort, your nurse or respiratory therapist will set it to a desired goal.  If possible, sit up straight or lean slightly forward. Try not to slouch.  Hold the incentive spirometer in an upright position. INSTRUCTIONS FOR USE  1. Sit on the  edge of your bed if possible, or sit up as far as you can in bed or on a chair. 2. Hold the incentive spirometer in an upright position. 3. Breathe out normally. 4. Place the mouthpiece in your mouth and seal your lips tightly around it. 5. Breathe in slowly and as deeply as possible, raising the piston or the ball toward the top of the column. 6. Hold your breath for 3-5 seconds or for as long as possible. Allow the piston or ball to fall to the bottom of the column. 7. Remove the mouthpiece from your mouth and breathe out normally. 8. Rest for a few seconds and repeat Steps 1 through 7 at least 10 times every 1-2 hours when you are awake. Take your time and take a few normal breaths between deep breaths. 9. The spirometer may include an indicator to show your best effort. Use the indicator as a goal to work toward during each repetition. 10. After each set of 10 deep breaths, practice coughing to be sure your lungs are clear. If you have an incision (the cut made at the time of surgery), support your incision when coughing by placing a pillow or rolled up towels firmly against it. Once you are able to get out of bed, walk around indoors and cough well. You may stop using the incentive spirometer when instructed by your caregiver.  RISKS AND COMPLICATIONS  Take your time so you do not get dizzy or light-headed.  If you are in pain, you may need to take or ask for pain medication before doing incentive spirometry. It is harder to take a deep breath if you are having pain. AFTER USE  Rest and breathe slowly and easily.  It can be helpful to keep track of a log of your progress. Your caregiver can provide you with a simple table to help with this. If you are using the spirometer at home, follow these instructions: [place] IF:  You are having difficultly using the spirometer.  You have trouble using the spirometer as often as instructed.  Your pain medication is not giving enough  relief while using the spirometer.  You develop fever of 100.5 F (38.1 C) or higher. SEEK IMMEDIATE MEDICAL CARE IF:   You cough up bloody sputum that had not been present before.  You develop fever of 102 F (38.9 C) or greater.  You develop worsening pain at or near the incision site. MAKE SURE YOU:   Understand these instructions.  Will watch your condition.  Will get help right away if you are not doing well or get worse. Document Released: 01/24/2007 Document Revised: 12/06/2011 Document Reviewed: 03/27/2007 ExitCare Patient Information 2014 ExitCare, [place].   ________________________________________________________________________    Please read over the following fact sheets that you were given: MRSA Information, coughing and deep breathing exercises, leg exercises

## 2014-04-23 ENCOUNTER — Ambulatory Visit (HOSPITAL_COMMUNITY)
Admission: RE | Admit: 2014-04-23 | Discharge: 2014-04-23 | Disposition: A | Discharge status: Home / Self Care | Payer: No Typology Code available for payment source | Source: Ambulatory Visit | Attending: Orthopedic Surgery | Admitting: Orthopedic Surgery

## 2014-04-23 ENCOUNTER — Encounter (HOSPITAL_COMMUNITY)
Admission: RE | Admit: 2014-04-23 | Discharge: 2014-04-23 | Disposition: A | Discharge status: Home / Self Care | Payer: No Typology Code available for payment source | Source: Ambulatory Visit | Attending: Orthopedic Surgery | Admitting: Orthopedic Surgery

## 2014-04-23 ENCOUNTER — Encounter (HOSPITAL_COMMUNITY): Payer: Self-pay

## 2014-04-23 DIAGNOSIS — Z01818 Encounter for other preprocedural examination: Secondary | ICD-10-CM | POA: Diagnosis present

## 2014-04-23 DIAGNOSIS — Z01812 Encounter for preprocedural laboratory examination: Secondary | ICD-10-CM | POA: Diagnosis not present

## 2014-04-23 DIAGNOSIS — M169 Osteoarthritis of hip, unspecified: Secondary | ICD-10-CM | POA: Diagnosis not present

## 2014-04-23 DIAGNOSIS — M1610 Unilateral primary osteoarthritis, unspecified hip: Secondary | ICD-10-CM | POA: Diagnosis not present

## 2014-04-23 HISTORY — DX: Anxiety disorder, unspecified: F41.9

## 2014-04-23 HISTORY — DX: Calculus of kidney: N20.0

## 2014-04-23 LAB — COMPREHENSIVE METABOLIC PANEL
ALBUMIN: 3.7 g/dL (ref 3.5–5.2)
ALK PHOS: 61 U/L (ref 39–117)
ALT: 37 U/L — AB (ref 0–35)
ANION GAP: 13 (ref 5–15)
AST: 38 U/L — ABNORMAL HIGH (ref 0–37)
BUN: 14 mg/dL (ref 6–23)
CO2: 26 mEq/L (ref 19–32)
Calcium: 9.6 mg/dL (ref 8.4–10.5)
Chloride: 102 mEq/L (ref 96–112)
Creatinine, Ser: 1 mg/dL (ref 0.50–1.10)
GFR calc Af Amer: 72 mL/min — ABNORMAL LOW (ref >90)
GFR calc non Af Amer: 62 mL/min — ABNORMAL LOW (ref >90)
Glucose, Bld: 128 mg/dL — ABNORMAL HIGH (ref 70–99)
POTASSIUM: 4.3 meq/L (ref 3.7–5.3)
SODIUM: 141 meq/L (ref 137–147)
TOTAL PROTEIN: 6.7 g/dL (ref 6.0–8.3)
Total Bilirubin: 0.3 mg/dL (ref 0.3–1.2)

## 2014-04-23 LAB — URINALYSIS, ROUTINE W REFLEX MICROSCOPIC
Bilirubin Urine: NEGATIVE
Glucose, UA: NEGATIVE mg/dL
HGB URINE DIPSTICK: NEGATIVE
Ketones, ur: NEGATIVE mg/dL
Leukocytes, UA: NEGATIVE
Nitrite: NEGATIVE
Protein, ur: NEGATIVE mg/dL
Specific Gravity, Urine: 1.017 (ref 1.005–1.030)
Urobilinogen, UA: 0.2 mg/dL (ref 0.0–1.0)
pH: 5.5 (ref 5.0–8.0)

## 2014-04-23 LAB — PROTIME-INR
INR: 0.93 (ref 0.00–1.49)
Prothrombin Time: 12.5 seconds (ref 11.6–15.2)

## 2014-04-23 LAB — CBC
HCT: 35.1 % — ABNORMAL LOW (ref 36.0–46.0)
Hemoglobin: 11.4 g/dL — ABNORMAL LOW (ref 12.0–15.0)
MCH: 30.8 pg (ref 26.0–34.0)
MCHC: 32.5 g/dL (ref 30.0–36.0)
MCV: 94.9 fL (ref 78.0–100.0)
PLATELETS: 246 10*3/uL (ref 150–400)
RBC: 3.7 MIL/uL — ABNORMAL LOW (ref 3.87–5.11)
RDW: 12.9 % (ref 11.5–15.5)
WBC: 7 10*3/uL (ref 4.0–10.5)

## 2014-04-23 LAB — APTT: APTT: 30 s (ref 24–37)

## 2014-04-23 LAB — SURGICAL PCR SCREEN
MRSA, PCR: NEGATIVE
Staphylococcus aureus: NEGATIVE

## 2014-04-23 NOTE — Progress Notes (Signed)
EKG 05/24/13 EPIC  Clearance note from Dr Maryellen PileEason 02/27/2014 on chart

## 2014-04-23 NOTE — Progress Notes (Signed)
EKG - 05/24/13 EPIC  CXR- 04/23/14 EPIC

## 2014-04-28 ENCOUNTER — Other Ambulatory Visit: Payer: Self-pay | Admitting: Orthopedic Surgery

## 2014-04-28 NOTE — H&P (Signed)
[name] DOB: January 10, 1956 Married / Language: English / Race: White Female Date of Admission:  05/01/2014 Chief Complaint:  Right Hip Pain History of Present Illness  The patient is a 58 year old female who comes in for a preoperative History and Physical. The patient is scheduled for a right total hip arthroplasty (anterior approach) to be performed by Dr. Gus Rankin. Aluisio, MD at [facility] on 05/01/2014. The patient is a 58 year old female who presents for follow up of their hip. The patient is being followed for their right hip pain and osteoarthritis. Symptoms reported today include: pain. The patient feels that they are doing poorly and report their pain level to be moderate to severe (usually stays at a 8, can increase w/ any walking to a 10). The following medication has been used for pain control: tramadol, flector patches, lidoderm patches, and voltaren gel. The patient presents today following 4 weeks post intraarticular hip injection. Note for "Follow-up Hip": No relief w/ intraarticular hip injection. No change since last office visit. Pain radiates down to the knee. Unfortunately, the hip is getting progressively worse. She has pain in her groin, lateral hip, radiating down her thigh. It is also hurting in the lower lumbar area. The interarticular injection provided minimal benefit and is no longer working at all. She is getting progressively worse in regards to range of motion also. She has got advanced arthritis of that hip. Unfortunately, an intraarticular injection did not provide any long lasting benefit. At this point, the most predictable means of improving her pain and function is going to be total hip arthroplasty. We did discuss this in detail and she is going to proceed with an anterior total hip. They have been treated conservatively in the past for the above stated problem and despite conservative measures, they continue to have progressive pain and severe functional  limitations and dysfunction. They have failed non-operative management including home exercise, medications, and injections. It is felt that they would benefit from undergoing total joint replacement. Risks and benefits of the procedure have been discussed with the patient and they elect to proceed with surgery. There are no active contraindications to surgery such as ongoing infection or rapidly progressive neurological disease.  Allergies Ceftin *CEPHALOSPORINS* Codeine Sulfate *ANALGESICS - OPIOID* Cymbalta *ANTIDEPRESSANTS*  Problem List/Past Medical  Lumbar pain (724.2) Status post unicompartmental knee replacement, right (V43.65  Z96.651) Osteoarthritis, knee (715.96  M17.9) Peripheral arteriosclerosis (440.20  I70.209) Osteoarthritis of right hip (715.95  M16.11) Anxiety Disorder Bronchitis Past History Migraine Headache Impaired Vision wears glasses Kidney Stone Degenerative Disc Disease Hypothyroidism Non-Insulin Dependent Diabetes Mellitus Osteoporosis Chronic Pain Hyperthyroidism Past History Migraine Headache Hypercholesterolemia Gastroesophageal Reflux Disease Measles Fibrocystic Disease Of Breast Mumps Menopause Hiatal Hernia   Family History Hypertension mother and grandfather mothers side Kidney disease mother Diabetes Mellitus First Degree Relatives. mother and brother Cancer grandmother mothers side Osteoarthritis grandmother mothers side Rheumatoid Arthritis sister and brother Congestive Heart Failure First Degree Relatives. brother Heart Disease father, brother and grandfather mothers side Heart disease in female family member before age 40 Chronic Obstructive Lung Disease brother  Social History  Advance Directives Living Will Current work status working full time Number of flights of stairs before winded 4-5 Drug/Alcohol Rehab (Previously) no Illicit drug use no Drug/Alcohol Rehab (Currently)  no Exercise Exercises never Pain Contract no Children 1 Living situation live with spouse Tobacco use Never smoker. never smoker Marital status married Alcohol use never consumed alcohol Post-Surgical Plans Plan is  to go home.  Medication History  Voltaren (1% Gel, 4 Transdermal applied to affected area qid, Taken starting 04/25/2013) Active. Amaryl (Oral) Specific dose unknown - Active. Pamelor (Oral) Specific dose unknown - Active. Lidoderm (External) Specific dose unknown - Active. Flector (External) Specific dose unknown - Active. Norvasc (Oral) Specific dose unknown - Active. Synthroid (Oral) Specific dose unknown - Active. Toprol XL (Oral) Specific dose unknown - Active. NexIUM (Oral) Specific dose unknown - Active. Zocor (Oral) Specific dose unknown - Active. Glucophage (Oral) Specific dose unknown - Active. Vitamin D (50000UNIT Capsule, Oral) Active. Mobic (15MG  Tablet, Oral) Active. Colace (100MG  Capsule, Oral) Active. ALPRAZolam (0.5MG  Tablet, Oral) Active. TraMADol HCl (50MG  Tablet, Oral) Active.  Past Surgical History D & C Date: 05/1978. Neurostimulator Placement Left Lower Back Date: 02/2009. Breast Mass; Local Excision left Breast Biopsy left Hysterectomy Date: 11/1978. complete (cancerous) Renal Stent Placement Date: 07/2012. Cholecystectomy Date: 08/2012. Arthroscopic Knee Surgery - Right June 2012, December 2013 Colonoscopy Date: 08/2009. Removal Renal Stent Date: 07/2012.   Review of Systems General Not Present- Chills, Fatigue, Fever, Memory Loss, Night Sweats, Weight Gain and Weight Loss. Skin Not Present- Eczema, Hives, Itching, Lesions and Rash. HEENT Not Present- Dentures, Double Vision, Headache, Hearing Loss, Tinnitus and Visual Loss. Respiratory Not Present- Allergies, Chronic Cough, Coughing up blood, Shortness of breath at rest and Shortness of breath with exertion. Cardiovascular Not Present- Chest Pain,  Difficulty Breathing Lying Down, Murmur, Palpitations, Racing/skipping heartbeats and Swelling. Gastrointestinal Not Present- Abdominal Pain, Bloody Stool, Constipation, Diarrhea, Difficulty Swallowing, Heartburn, Jaundice, Loss of appetitie, Nausea and Vomiting. Female Genitourinary Not Present- Blood in Urine, Discharge, Flank Pain, Incontinence, Painful Urination, Urgency, Urinary frequency, Urinary Retention, Urinating at Night and Weak urinary stream. Musculoskeletal Not Present- Back Pain, Joint Pain, Joint Swelling, Morning Stiffness, Muscle Pain, Muscle Weakness and Spasms. Neurological Not Present- Blackout spells, Difficulty with balance, Dizziness, Paralysis, Tremor and Weakness. Psychiatric Not Present- Insomnia.   Vitals Weight: 175 lb Height: 64in Weight was reported by patient. Height was reported by patient. Body Surface Area: 1.89 m Body Mass Index: 30.04 kg/m BP: 112/72 (Sitting, Right Arm, Standard)    Physical Exam General Mental Status -Alert, cooperative and good historian. General Appearance-pleasant, Not in acute distress. Orientation-Oriented X3. Build & Nutrition-Well nourished and Well developed.  Head and Neck Head-normocephalic, atraumatic . Neck Global Assessment - supple, no bruit auscultated on the right, no bruit auscultated on the left.  Eye Vision-Wears corrective lenses. Pupil - Bilateral-Regular and Round. Motion - Bilateral-EOMI.  Chest and Lung Exam Auscultation Breath sounds - clear at anterior chest wall and clear at posterior chest wall. Adventitious sounds - No Adventitious sounds.  Cardiovascular Auscultation Rhythm - Regular rate and rhythm. Heart Sounds - S1 WNL and S2 WNL. Murmurs & Other Heart Sounds - Auscultation of the heart reveals - No Murmurs.  Abdomen Palpation/Percussion Tenderness - Abdomen is non-tender to palpation. Rigidity (guarding) - Abdomen is soft. Auscultation Auscultation of the  abdomen reveals - Bowel sounds normal.  Female Genitourinary Note: Not done, not pertinent to present illness   Musculoskeletal Note: General: She is alert and oriented, no apparent distress.  Musculoskeletal: Her left hip has normal range of motion with no discomfort. Her right hip flexion is 90. No internal rotation, about 20 external rotation, 20 abduction. Her right knee range is 0 to 125 with no tenderness or instability.   Assessment & Plan Osteoarthritis of right hip (715.95  M16.11) Impression: Right Hip Note:Plan is for a Right Total Hip Replacement  by Dr. Lequita HaltAluisio.  [place]  PCP - Dr. Kathlee NationsPaul Eason, [place] - Patient has been seen preoperatively and felt to be stable for surgery.  The patient does not have any contraindications and will receive TXA (tranexamic acid) prior to surgery.  Please note that the patient does get nauseated with anesthesia.   Signed electronically by Beckey RutterAlezandrew L Perkins, III PA-C

## 2014-05-01 ENCOUNTER — Inpatient Hospital Stay (HOSPITAL_COMMUNITY): Payer: No Typology Code available for payment source | Admitting: Anesthesiology

## 2014-05-01 ENCOUNTER — Inpatient Hospital Stay (HOSPITAL_COMMUNITY): Payer: No Typology Code available for payment source

## 2014-05-01 ENCOUNTER — Encounter (HOSPITAL_COMMUNITY): Payer: No Typology Code available for payment source | Admitting: Anesthesiology

## 2014-05-01 ENCOUNTER — Encounter (HOSPITAL_COMMUNITY)
Admission: RE | Disposition: A | Discharge status: Home Health | Payer: Self-pay | Source: Ambulatory Visit | Attending: Orthopedic Surgery

## 2014-05-01 ENCOUNTER — Inpatient Hospital Stay (HOSPITAL_COMMUNITY)
Admission: RE | Admit: 2014-05-01 | Discharge: 2014-05-03 | DRG: 470 | Disposition: A | Discharge status: Home Health | Payer: No Typology Code available for payment source | Source: Ambulatory Visit | Attending: Orthopedic Surgery | Admitting: Orthopedic Surgery

## 2014-05-01 ENCOUNTER — Encounter (HOSPITAL_COMMUNITY): Payer: Self-pay | Admitting: *Deleted

## 2014-05-01 DIAGNOSIS — Z833 Family history of diabetes mellitus: Secondary | ICD-10-CM | POA: Diagnosis not present

## 2014-05-01 DIAGNOSIS — Z96659 Presence of unspecified artificial knee joint: Secondary | ICD-10-CM | POA: Diagnosis not present

## 2014-05-01 DIAGNOSIS — D62 Acute posthemorrhagic anemia: Secondary | ICD-10-CM | POA: Diagnosis not present

## 2014-05-01 DIAGNOSIS — M1610 Unilateral primary osteoarthritis, unspecified hip: Principal | ICD-10-CM | POA: Diagnosis present

## 2014-05-01 DIAGNOSIS — Z6830 Body mass index (BMI) 30.0-30.9, adult: Secondary | ICD-10-CM | POA: Diagnosis not present

## 2014-05-01 DIAGNOSIS — M25559 Pain in unspecified hip: Secondary | ICD-10-CM | POA: Diagnosis present

## 2014-05-01 DIAGNOSIS — M169 Osteoarthritis of hip, unspecified: Principal | ICD-10-CM

## 2014-05-01 DIAGNOSIS — E785 Hyperlipidemia, unspecified: Secondary | ICD-10-CM | POA: Diagnosis present

## 2014-05-01 DIAGNOSIS — E119 Type 2 diabetes mellitus without complications: Secondary | ICD-10-CM | POA: Diagnosis present

## 2014-05-01 DIAGNOSIS — Z87442 Personal history of urinary calculi: Secondary | ICD-10-CM

## 2014-05-01 DIAGNOSIS — K219 Gastro-esophageal reflux disease without esophagitis: Secondary | ICD-10-CM | POA: Diagnosis present

## 2014-05-01 DIAGNOSIS — E039 Hypothyroidism, unspecified: Secondary | ICD-10-CM | POA: Diagnosis present

## 2014-05-01 DIAGNOSIS — Z8249 Family history of ischemic heart disease and other diseases of the circulatory system: Secondary | ICD-10-CM | POA: Diagnosis not present

## 2014-05-01 DIAGNOSIS — M1611 Unilateral primary osteoarthritis, right hip: Secondary | ICD-10-CM

## 2014-05-01 DIAGNOSIS — Z96641 Presence of right artificial hip joint: Secondary | ICD-10-CM

## 2014-05-01 HISTORY — PX: TOTAL HIP ARTHROPLASTY: SHX124

## 2014-05-01 LAB — TYPE AND SCREEN
ABO/RH(D): O NEG
ANTIBODY SCREEN: NEGATIVE

## 2014-05-01 LAB — GLUCOSE, CAPILLARY
Glucose-Capillary: 107 mg/dL — ABNORMAL HIGH (ref 70–99)
Glucose-Capillary: 165 mg/dL — ABNORMAL HIGH (ref 70–99)
Glucose-Capillary: 266 mg/dL — ABNORMAL HIGH (ref 70–99)
Glucose-Capillary: 297 mg/dL — ABNORMAL HIGH (ref 70–99)

## 2014-05-01 SURGERY — ARTHROPLASTY, HIP, TOTAL, ANTERIOR APPROACH
Anesthesia: General | Site: Hip | Laterality: Right

## 2014-05-01 MED ORDER — VANCOMYCIN HCL IN DEXTROSE 1-5 GM/200ML-% IV SOLN
1000.0000 mg | INTRAVENOUS | Status: AC
  Administered 2014-05-01: 1000 mg via INTRAVENOUS

## 2014-05-01 MED ORDER — VANCOMYCIN HCL IN DEXTROSE 1-5 GM/200ML-% IV SOLN
INTRAVENOUS | Status: AC
  Filled 2014-05-01: qty 200

## 2014-05-01 MED ORDER — HYDROMORPHONE HCL PF 1 MG/ML IJ SOLN
0.5000 mg | INTRAMUSCULAR | Status: DC | PRN
  Administered 2014-05-01 – 2014-05-02 (×2): 1 mg via INTRAVENOUS
  Filled 2014-05-01 (×2): qty 1

## 2014-05-01 MED ORDER — FLEET ENEMA 7-19 GM/118ML RE ENEM: 1.0000 | ENEMA | Freq: Once | RECTAL | Status: AC | PRN

## 2014-05-01 MED ORDER — METOCLOPRAMIDE HCL 5 MG/ML IJ SOLN: 5.0000 mg | Freq: Three times a day (TID) | INTRAMUSCULAR | Status: DC | PRN

## 2014-05-01 MED ORDER — AMLODIPINE BESYLATE 2.5 MG PO TABS
2.5000 mg | ORAL_TABLET | Freq: Every day | ORAL | Status: DC
  Administered 2014-05-03: 2.5 mg via ORAL
  Filled 2014-05-01 (×3): qty 1

## 2014-05-01 MED ORDER — PROPOFOL 10 MG/ML IV BOLUS
INTRAVENOUS | Status: DC | PRN
  Administered 2014-05-01: 140 mg via INTRAVENOUS

## 2014-05-01 MED ORDER — METFORMIN HCL 500 MG PO TABS
1000.0000 mg | ORAL_TABLET | Freq: Two times a day (BID) | ORAL | Status: DC
  Filled 2014-05-01 (×3): qty 2

## 2014-05-01 MED ORDER — HYDROMORPHONE HCL 2 MG PO TABS
2.0000 mg | ORAL_TABLET | ORAL | Status: DC | PRN
  Administered 2014-05-01 (×2): 2 mg via ORAL
  Administered 2014-05-01 – 2014-05-02 (×2): 4 mg via ORAL
  Administered 2014-05-02: 2 mg via ORAL
  Administered 2014-05-02: 4 mg via ORAL
  Administered 2014-05-02: 2 mg via ORAL
  Administered 2014-05-03 (×2): 4 mg via ORAL
  Filled 2014-05-01: qty 1
  Filled 2014-05-01 (×5): qty 2
  Filled 2014-05-01: qty 1
  Filled 2014-05-01: qty 2
  Filled 2014-05-01: qty 1

## 2014-05-01 MED ORDER — KETOROLAC TROMETHAMINE 30 MG/ML IJ SOLN: 15.0000 mg | Freq: Once | INTRAMUSCULAR | Status: DC | PRN

## 2014-05-01 MED ORDER — BUPIVACAINE HCL (PF) 0.25 % IJ SOLN
INTRAMUSCULAR | Status: AC
  Filled 2014-05-01: qty 30

## 2014-05-01 MED ORDER — DEXAMETHASONE SODIUM PHOSPHATE 10 MG/ML IJ SOLN
10.0000 mg | Freq: Every day | INTRAMUSCULAR | Status: AC
  Filled 2014-05-01: qty 1

## 2014-05-01 MED ORDER — SODIUM CHLORIDE 0.9 % IJ SOLN
INTRAMUSCULAR | Status: AC
  Filled 2014-05-01: qty 50

## 2014-05-01 MED ORDER — ONDANSETRON HCL 4 MG/2ML IJ SOLN
INTRAMUSCULAR | Status: DC | PRN
  Administered 2014-05-01: 4 mg via INTRAVENOUS

## 2014-05-01 MED ORDER — KETAMINE HCL 10 MG/ML IJ SOLN
INTRAMUSCULAR | Status: DC | PRN
  Administered 2014-05-01: 10 mg via INTRAVENOUS
  Administered 2014-05-01: 20 mg via INTRAVENOUS
  Administered 2014-05-01 (×2): 10 mg via INTRAVENOUS

## 2014-05-01 MED ORDER — ROCURONIUM BROMIDE 100 MG/10ML IV SOLN
INTRAVENOUS | Status: DC | PRN
  Administered 2014-05-01: 50 mg via INTRAVENOUS

## 2014-05-01 MED ORDER — DICLOFENAC EPOLAMINE 1.3 % TD PTCH
1.0000 | MEDICATED_PATCH | Freq: Every day | TRANSDERMAL | Status: DC
  Administered 2014-05-03: 1 via TRANSDERMAL
  Filled 2014-05-01: qty 1

## 2014-05-01 MED ORDER — RIVAROXABAN 10 MG PO TABS
10.0000 mg | ORAL_TABLET | Freq: Every day | ORAL | Status: DC
  Administered 2014-05-02 – 2014-05-03 (×2): 10 mg via ORAL
  Filled 2014-05-01 (×3): qty 1

## 2014-05-01 MED ORDER — 0.9 % SODIUM CHLORIDE (POUR BTL) OPTIME
TOPICAL | Status: DC | PRN
  Administered 2014-05-01: 1000 mL

## 2014-05-01 MED ORDER — SODIUM CHLORIDE 0.9 % IV SOLN
INTRAVENOUS | Status: DC
  Administered 2014-05-01: 75 mL/h via INTRAVENOUS

## 2014-05-01 MED ORDER — HYDROMORPHONE HCL PF 1 MG/ML IJ SOLN
INTRAMUSCULAR | Status: AC
  Filled 2014-05-01: qty 1

## 2014-05-01 MED ORDER — FENTANYL CITRATE 0.05 MG/ML IJ SOLN: 25.0000 ug | Freq: Once | INTRAMUSCULAR | Status: DC

## 2014-05-01 MED ORDER — PROMETHAZINE HCL 25 MG/ML IJ SOLN: 6.2500 mg | INTRAMUSCULAR | Status: DC | PRN

## 2014-05-01 MED ORDER — DOCUSATE SODIUM 100 MG PO CAPS
100.0000 mg | ORAL_CAPSULE | Freq: Two times a day (BID) | ORAL | Status: DC
  Administered 2014-05-01 – 2014-05-03 (×4): 100 mg via ORAL

## 2014-05-01 MED ORDER — DEXAMETHASONE SODIUM PHOSPHATE 10 MG/ML IJ SOLN
INTRAMUSCULAR | Status: AC
  Filled 2014-05-01: qty 1

## 2014-05-01 MED ORDER — TRAMADOL HCL 50 MG PO TABS: 50.0000 mg | ORAL_TABLET | Freq: Four times a day (QID) | ORAL | Status: DC | PRN

## 2014-05-01 MED ORDER — LACTATED RINGERS IV SOLN
INTRAVENOUS | Status: DC
  Administered 2014-05-01: 1000 mL via INTRAVENOUS

## 2014-05-01 MED ORDER — INSULIN ASPART 100 UNIT/ML ~~LOC~~ SOLN
0.0000 [IU] | Freq: Three times a day (TID) | SUBCUTANEOUS | Status: DC
  Administered 2014-05-01: 8 [IU] via SUBCUTANEOUS
  Administered 2014-05-02: 5 [IU] via SUBCUTANEOUS

## 2014-05-01 MED ORDER — CHLORHEXIDINE GLUCONATE 4 % EX LIQD: 60.0000 mL | Freq: Once | CUTANEOUS | Status: DC

## 2014-05-01 MED ORDER — NEOSTIGMINE METHYLSULFATE 10 MG/10ML IV SOLN
INTRAVENOUS | Status: DC | PRN
  Administered 2014-05-01: 3 mg via INTRAVENOUS

## 2014-05-01 MED ORDER — KETAMINE HCL 10 MG/ML IJ SOLN
INTRAMUSCULAR | Status: AC
  Filled 2014-05-01: qty 1

## 2014-05-01 MED ORDER — MIDAZOLAM HCL 5 MG/5ML IJ SOLN
INTRAMUSCULAR | Status: DC | PRN
  Administered 2014-05-01: 2 mg via INTRAVENOUS

## 2014-05-01 MED ORDER — GLIMEPIRIDE 2 MG PO TABS
2.0000 mg | ORAL_TABLET | Freq: Every day | ORAL | Status: DC
  Administered 2014-05-02 – 2014-05-03 (×2): 2 mg via ORAL
  Filled 2014-05-01 (×3): qty 1

## 2014-05-01 MED ORDER — BUPIVACAINE LIPOSOME 1.3 % IJ SUSP
20.0000 mL | Freq: Once | INTRAMUSCULAR | Status: DC
  Filled 2014-05-01: qty 20

## 2014-05-01 MED ORDER — LACTATED RINGERS IV SOLN
INTRAVENOUS | Status: DC | PRN
  Administered 2014-05-01 (×2): via INTRAVENOUS

## 2014-05-01 MED ORDER — FENTANYL CITRATE 0.05 MG/ML IJ SOLN
INTRAMUSCULAR | Status: DC | PRN
  Administered 2014-05-01: 50 ug via INTRAVENOUS
  Administered 2014-05-01 (×2): 100 ug via INTRAVENOUS

## 2014-05-01 MED ORDER — VANCOMYCIN HCL IN DEXTROSE 1-5 GM/200ML-% IV SOLN
1000.0000 mg | Freq: Two times a day (BID) | INTRAVENOUS | Status: AC
  Administered 2014-05-01: 1000 mg via INTRAVENOUS
  Filled 2014-05-01: qty 200

## 2014-05-01 MED ORDER — HYDROMORPHONE HCL PF 1 MG/ML IJ SOLN
0.2500 mg | INTRAMUSCULAR | Status: DC | PRN
  Administered 2014-05-01 (×4): 0.5 mg via INTRAVENOUS

## 2014-05-01 MED ORDER — ACETAMINOPHEN 650 MG RE SUPP: 650.0000 mg | Freq: Four times a day (QID) | RECTAL | Status: DC | PRN

## 2014-05-01 MED ORDER — NORTRIPTYLINE HCL 25 MG PO CAPS
100.0000 mg | ORAL_CAPSULE | Freq: Every day | ORAL | Status: DC
  Administered 2014-05-01 – 2014-05-02 (×2): 100 mg via ORAL
  Filled 2014-05-01 (×3): qty 4

## 2014-05-01 MED ORDER — ACETAMINOPHEN 500 MG PO TABS
1000.0000 mg | ORAL_TABLET | Freq: Four times a day (QID) | ORAL | Status: AC
  Administered 2014-05-01 – 2014-05-02 (×3): 1000 mg via ORAL
  Filled 2014-05-01 (×4): qty 2

## 2014-05-01 MED ORDER — DEXAMETHASONE SODIUM PHOSPHATE 10 MG/ML IJ SOLN
10.0000 mg | Freq: Once | INTRAMUSCULAR | Status: AC
  Administered 2014-05-01: 10 mg via INTRAVENOUS

## 2014-05-01 MED ORDER — TRANEXAMIC ACID 100 MG/ML IV SOLN
1000.0000 mg | INTRAVENOUS | Status: AC
  Administered 2014-05-01: 1000 mg via INTRAVENOUS
  Filled 2014-05-01: qty 10

## 2014-05-01 MED ORDER — LIDOCAINE HCL (CARDIAC) 20 MG/ML IV SOLN
INTRAVENOUS | Status: AC
  Filled 2014-05-01: qty 5

## 2014-05-01 MED ORDER — METOCLOPRAMIDE HCL 5 MG PO TABS
5.0000 mg | ORAL_TABLET | Freq: Three times a day (TID) | ORAL | Status: DC | PRN
  Filled 2014-05-01: qty 2

## 2014-05-01 MED ORDER — PANTOPRAZOLE SODIUM 40 MG PO TBEC
80.0000 mg | DELAYED_RELEASE_TABLET | Freq: Every day | ORAL | Status: DC
  Administered 2014-05-01: 80 mg via ORAL
  Filled 2014-05-01 (×2): qty 2

## 2014-05-01 MED ORDER — BUPIVACAINE HCL (PF) 0.25 % IJ SOLN
INTRAMUSCULAR | Status: DC | PRN
  Administered 2014-05-01: 30 mL

## 2014-05-01 MED ORDER — SODIUM CHLORIDE 0.9 % IV SOLN: INTRAVENOUS | Status: DC

## 2014-05-01 MED ORDER — LEVOTHYROXINE SODIUM 50 MCG PO TABS
50.0000 ug | ORAL_TABLET | Freq: Every day | ORAL | Status: DC
  Administered 2014-05-02: 50 ug via ORAL
  Filled 2014-05-01 (×4): qty 1

## 2014-05-01 MED ORDER — ACETAMINOPHEN 325 MG PO TABS
650.0000 mg | ORAL_TABLET | Freq: Four times a day (QID) | ORAL | Status: DC | PRN
  Administered 2014-05-03: 650 mg via ORAL
  Filled 2014-05-01: qty 2

## 2014-05-01 MED ORDER — SIMVASTATIN 20 MG PO TABS
20.0000 mg | ORAL_TABLET | Freq: Every evening | ORAL | Status: DC
  Administered 2014-05-01 – 2014-05-02 (×2): 20 mg via ORAL
  Filled 2014-05-01 (×3): qty 1

## 2014-05-01 MED ORDER — MENTHOL 3 MG MT LOZG: 1.0000 | LOZENGE | OROMUCOSAL | Status: DC | PRN

## 2014-05-01 MED ORDER — ACETAMINOPHEN 10 MG/ML IV SOLN
1000.0000 mg | Freq: Once | INTRAVENOUS | Status: AC
  Administered 2014-05-01: 1000 mg via INTRAVENOUS
  Filled 2014-05-01: qty 100

## 2014-05-01 MED ORDER — SODIUM CHLORIDE 0.9 % IJ SOLN
INTRAMUSCULAR | Status: DC | PRN
  Administered 2014-05-01: 30 mL

## 2014-05-01 MED ORDER — DIPHENHYDRAMINE HCL 12.5 MG/5ML PO ELIX: 12.5000 mg | ORAL_SOLUTION | ORAL | Status: DC | PRN

## 2014-05-01 MED ORDER — DICLOFENAC EPOLAMINE 1.3 % TD PTCH: 1.0000 | MEDICATED_PATCH | Freq: Every day | TRANSDERMAL | Status: DC

## 2014-05-01 MED ORDER — ONDANSETRON HCL 4 MG PO TABS: 4.0000 mg | ORAL_TABLET | Freq: Four times a day (QID) | ORAL | Status: DC | PRN

## 2014-05-01 MED ORDER — KETOROLAC TROMETHAMINE 15 MG/ML IJ SOLN
7.5000 mg | Freq: Four times a day (QID) | INTRAMUSCULAR | Status: AC | PRN
  Administered 2014-05-01: 7.5 mg via INTRAVENOUS

## 2014-05-01 MED ORDER — PHENOL 1.4 % MT LIQD: 1.0000 | OROMUCOSAL | Status: DC | PRN

## 2014-05-01 MED ORDER — LIDOCAINE HCL (CARDIAC) 20 MG/ML IV SOLN
INTRAVENOUS | Status: DC | PRN
  Administered 2014-05-01: 60 mg via INTRAVENOUS

## 2014-05-01 MED ORDER — FENTANYL CITRATE 0.05 MG/ML IJ SOLN
INTRAMUSCULAR | Status: AC
  Filled 2014-05-01: qty 5

## 2014-05-01 MED ORDER — POLYETHYLENE GLYCOL 3350 17 G PO PACK: 17.0000 g | PACK | Freq: Every day | ORAL | Status: DC | PRN

## 2014-05-01 MED ORDER — METOPROLOL TARTRATE 50 MG PO TABS
50.0000 mg | ORAL_TABLET | Freq: Two times a day (BID) | ORAL | Status: DC
  Administered 2014-05-01 – 2014-05-03 (×3): 50 mg via ORAL
  Filled 2014-05-01 (×5): qty 1

## 2014-05-01 MED ORDER — GLYCOPYRROLATE 0.2 MG/ML IJ SOLN
INTRAMUSCULAR | Status: DC | PRN
  Administered 2014-05-01: 0.4 mg via INTRAVENOUS
  Administered 2014-05-01: 0.2 mg via INTRAVENOUS

## 2014-05-01 MED ORDER — METHOCARBAMOL 500 MG PO TABS
500.0000 mg | ORAL_TABLET | Freq: Four times a day (QID) | ORAL | Status: DC | PRN
Start: 2014-05-01 — End: 2014-05-03
  Administered 2014-05-01 – 2014-05-03 (×5): 500 mg via ORAL
  Filled 2014-05-01 (×5): qty 1

## 2014-05-01 MED ORDER — ALPRAZOLAM 0.25 MG PO TABS
0.5000 mg | ORAL_TABLET | Freq: Two times a day (BID) | ORAL | Status: DC
  Administered 2014-05-01 – 2014-05-03 (×4): 0.5 mg via ORAL
  Filled 2014-05-01 (×4): qty 2

## 2014-05-01 MED ORDER — HYDROMORPHONE HCL PF 1 MG/ML IJ SOLN
INTRAMUSCULAR | Status: AC
  Administered 2014-05-01: 1 mg via INTRAVENOUS
  Filled 2014-05-01: qty 1

## 2014-05-01 MED ORDER — ROCURONIUM BROMIDE 100 MG/10ML IV SOLN
INTRAVENOUS | Status: AC
  Filled 2014-05-01: qty 1

## 2014-05-01 MED ORDER — ONDANSETRON HCL 4 MG/2ML IJ SOLN: 4.0000 mg | Freq: Four times a day (QID) | INTRAMUSCULAR | Status: DC | PRN

## 2014-05-01 MED ORDER — LACTATED RINGERS IV SOLN: INTRAVENOUS | Status: DC

## 2014-05-01 MED ORDER — KETOROLAC TROMETHAMINE 15 MG/ML IJ SOLN
INTRAMUSCULAR | Status: AC
Start: 2014-05-01 — End: 2014-05-02
  Filled 2014-05-01: qty 1

## 2014-05-01 MED ORDER — BUPIVACAINE LIPOSOME 1.3 % IJ SUSP
INTRAMUSCULAR | Status: DC | PRN
  Administered 2014-05-01: 20 mL

## 2014-05-01 MED ORDER — ONDANSETRON HCL 4 MG/2ML IJ SOLN
INTRAMUSCULAR | Status: AC
  Filled 2014-05-01: qty 2

## 2014-05-01 MED ORDER — BISACODYL 10 MG RE SUPP: 10.0000 mg | Freq: Every day | RECTAL | Status: DC | PRN

## 2014-05-01 MED ORDER — DEXTROSE 5 % IV SOLN
500.0000 mg | Freq: Four times a day (QID) | INTRAVENOUS | Status: DC | PRN
  Administered 2014-05-01: 500 mg via INTRAVENOUS
  Filled 2014-05-01: qty 5

## 2014-05-01 MED ORDER — MIDAZOLAM HCL 2 MG/2ML IJ SOLN
INTRAMUSCULAR | Status: AC
  Filled 2014-05-01: qty 2

## 2014-05-01 MED ORDER — DEXAMETHASONE 4 MG PO TABS
10.0000 mg | ORAL_TABLET | Freq: Every day | ORAL | Status: AC
  Administered 2014-05-02: 10 mg via ORAL
  Filled 2014-05-01: qty 1

## 2014-05-01 MED ORDER — PROPOFOL 10 MG/ML IV BOLUS
INTRAVENOUS | Status: AC
  Filled 2014-05-01: qty 20

## 2014-05-01 SURGICAL SUPPLY — 39 items
BAG ZIPLOCK 12X15 (MISCELLANEOUS) IMPLANT
BLADE EXTENDED COATED 6.5IN (ELECTRODE) ×3 IMPLANT
BLADE SAG 18X100X1.27 (BLADE) ×3 IMPLANT
CAPT HIP PF COP ×3 IMPLANT
CLOSURE WOUND 1/2 X4 (GAUZE/BANDAGES/DRESSINGS) ×1
COVER PERINEAL POST (MISCELLANEOUS) ×3 IMPLANT
DECANTER SPIKE VIAL GLASS SM (MISCELLANEOUS) ×3 IMPLANT
DRAPE C-ARM 42X120 X-RAY (DRAPES) ×3 IMPLANT
DRAPE STERI IOBAN 125X83 (DRAPES) ×3 IMPLANT
DRAPE U-SHAPE 47X51 STRL (DRAPES) ×9 IMPLANT
DRSG ADAPTIC 3X8 NADH LF (GAUZE/BANDAGES/DRESSINGS) ×3 IMPLANT
DRSG MEPILEX BORDER 4X4 (GAUZE/BANDAGES/DRESSINGS) ×3 IMPLANT
DRSG MEPILEX BORDER 4X8 (GAUZE/BANDAGES/DRESSINGS) ×3 IMPLANT
DURAPREP 26ML APPLICATOR (WOUND CARE) ×3 IMPLANT
ELECT REM PT RETURN 9FT ADLT (ELECTROSURGICAL) ×3
ELECTRODE REM PT RTRN 9FT ADLT (ELECTROSURGICAL) ×1 IMPLANT
EVACUATOR 1/8 PVC DRAIN (DRAIN) ×3 IMPLANT
FACESHIELD WRAPAROUND (MASK) ×12 IMPLANT
GAUZE SPONGE 4X4 12PLY STRL (GAUZE/BANDAGES/DRESSINGS) IMPLANT
GLOVE BIO SURGEON STRL SZ7.5 (GLOVE) ×3 IMPLANT
GLOVE BIO SURGEON STRL SZ8 (GLOVE) ×6 IMPLANT
GLOVE BIOGEL PI IND STRL 8 (GLOVE) ×2 IMPLANT
GLOVE BIOGEL PI INDICATOR 8 (GLOVE) ×4
GOWN STRL REUS W/TWL LRG LVL3 (GOWN DISPOSABLE) ×3 IMPLANT
GOWN STRL REUS W/TWL XL LVL3 (GOWN DISPOSABLE) ×3 IMPLANT
KIT BASIN OR (CUSTOM PROCEDURE TRAY) ×3 IMPLANT
NDL SAFETY ECLIPSE 18X1.5 (NEEDLE) ×2 IMPLANT
NEEDLE HYPO 18GX1.5 SHARP (NEEDLE) ×4
PACK TOTAL JOINT (CUSTOM PROCEDURE TRAY) ×3 IMPLANT
STRIP CLOSURE SKIN 1/2X4 (GAUZE/BANDAGES/DRESSINGS) ×2 IMPLANT
SUT ETHIBOND NAB CT1 #1 30IN (SUTURE) ×3 IMPLANT
SUT MNCRL AB 4-0 PS2 18 (SUTURE) ×3 IMPLANT
SUT VIC AB 2-0 CT1 27 (SUTURE) ×4
SUT VIC AB 2-0 CT1 TAPERPNT 27 (SUTURE) ×2 IMPLANT
SUT VLOC 180 0 24IN GS25 (SUTURE) ×3 IMPLANT
SYRINGE 20CC LL (MISCELLANEOUS) ×3 IMPLANT
SYRINGE 60CC LL (MISCELLANEOUS) ×3 IMPLANT
TOWEL OR 17X26 10 PK STRL BLUE (TOWEL DISPOSABLE) ×3 IMPLANT
TRAY FOLEY CATH 14FRSI W/METER (CATHETERS) ×3 IMPLANT

## 2014-05-01 NOTE — Op Note (Signed)
OPERATIVE REPORT  PREOPERATIVE DIAGNOSIS: Osteoarthritis of the Right hip.   POSTOPERATIVE DIAGNOSIS: Osteoarthritis of the Right  hip.   PROCEDURE: Right total hip arthroplasty, anterior approach.   SURGEON: Ollen GrossFrank Taji Sather, MD   ASSISTANT: Avel Peacerew Perkins, PA-C  ANESTHESIA:  General  ESTIMATED BLOOD LOSS:-600 ml   DRAINS: Hemovac x1.   COMPLICATIONS: None   CONDITION: PACU - hemodynamically stable.   BRIEF CLINICAL NOTE: [name] is a 58 y.o. female who has advanced end-  stage arthritis of his Right  hip with progressively worsening pain and  dysfunction.The patient has failed nonoperative management and presents for  total hip arthroplasty.   PROCEDURE IN DETAIL: After successful administration of General anesthetic, the traction [facility] were [facility] both  feet and the [facility], boots placed into the leg  holders. The Right hip was then isolated from the perineum with plastic  drapes and prepped and draped in the usual sterile fashion. ASIS and  greater trochanter were marked and a oblique incision was made, starting  at about 1 cm lateral and 2 cm distal to the ASIS and coursing towards  the anterior cortex of the femur. The skin was cut with a 10 blade  through subcutaneous tissue to the level of the fascia overlying the  tensor fascia lata muscle. The fascia was then incised in line with the  incision at the junction of the anterior third and posterior 2/3rd. The  muscle was teased off the fascia and then the interval between the TFL  and the rectus was developed. The Hohmann retractor was then placed at  the top of the femoral neck over the capsule. The vessels overlying the  capsule were cauterized and the fat on top of the capsule was removed.  A Hohmann retractor was then placed anterior underneath the rectus  femoris to give exposure to the entire anterior capsule. A T-shaped  capsulotomy was performed.  The edges were tagged and the femoral head  was identified.       Osteophytes are removed off the superior acetabulum.  The femoral neck was then cut in situ with an oscillating saw. Traction  was then applied to the left lower extremity utilizing the [facility]  traction. The femoral head was then removed. Retractors were placed  around the acetabulum and then circumferential removal of the labrum was  performed. Osteophytes were also removed. Reaming starts at 45 mm to  medialize and  Increased in 2 mm increments to 47 mm. We reamed in  approximately 40 degrees of abduction, 20 degrees anteversion. A 48 mm  pinnacle acetabular shell was then impacted in anatomic position under  fluoroscopic guidance with excellent purchase. We did not need to place  any additional dome screws. A 28 mm neutral + 4 marathon liner was then  placed into the acetabular shell.       The femoral lift was then placed along the lateral aspect of the femur  just distal to the vastus ridge. The leg was  externally rotated and capsule  was stripped off the inferior aspect of the femoral neck down to the  level of the lesser trochanter, this was done with electrocautery. The femur was lifted after this was performed. The  leg was then placed and extended in adducted position to essentially delivering the femur. We also removed the capsule superiorly and the  piriformis from the piriformis fossa to  gain excellent exposure of the  proximal femur. Rongeur was used to remove some cancellous bone to get  into the lateral portion of the proximal femur for placement of the  initial starter reamer. The starter broaches was placed  the starter broach  and was shown to go down the center of the canal. Broaching  with the  Corail system was then performed starting at size 8, coursing  Up to size 10. A size 10 had excellent torsional and rotational  and axial stability. The trial high offset neck was then placed  with a 28 + 1.5 trial  head. The hip was then reduced. We confirmed that  the stem was in the canal both on AP and lateral x-rays. It also has excellent sizing. The hip was reduced with outstanding stability through full extension, full external rotation,  and then flexion in adduction internal rotation. AP pelvis was taken  and the leg lengths were measured and found to be exactly equal. Hip  was then dislocated again and the femoral head and neck removed. The  femoral broach was removed. Size 10 Corail stem with a high offset  neck was then impacted into the femur following native anteversion. Has  excellent purchase in the canal. Excellent torsional and rotational and  axial stability. It is confirmed to be in the canal on AP and lateral  fluoroscopic views. The 28 + 1.5 ceramic head was placed and the hip  reduced with outstanding stability. Again AP pelvis was taken and it  confirmed that the leg lengths were equal. The wound was then copiously  irrigated with saline solution and the capsule reattached and repaired  with Ethibond suture.  20 mL of Exparel mixed with 50 mL of saline then additional 20 ml of .25% Bupivicaine injected into the capsule and into the edge of the tensor fascia lata as well as subcutaneous tissue. The fascia overlying the tensor fascia lata was  then closed with a running #1 V-Loc. Subcu was closed with interrupted  2-0 Vicryl and subcuticular running 4-0 Monocryl. Incision was cleaned  and dried. Steri-Strips and a bulky sterile dressing applied. Hemovac  drain was hooked to suction and then he was awakened and transported to  recovery in stable condition.        Please note that a surgical assistant was a medical necessity for this procedure to perform it in a safe and expeditious manner. Assistant was necessary to provide appropriate retraction of vital neurovascular structures and to prevent femoral fracture and allow for anatomic placement of the prosthesis.  Ollen Gross, M.D.

## 2014-05-01 NOTE — Transfer of Care (Signed)
Immediate Anesthesia Transfer of Care Note  Patient: [name]  Procedure(s) Performed: Procedure(s): RIGHT TOTAL HIP ARTHROPLASTY ANTERIOR APPROACH (Right)  Patient Location: PACU  Anesthesia Type:General  Level of Consciousness: awake, alert  and oriented  Airway & Oxygen Therapy: Patient Spontanous Breathing and Patient connected to face mask oxygen  Post-op Assessment: Report given to PACU RN and Post -op Vital signs reviewed and stable  Post vital signs: Reviewed and stable  Complications: No apparent anesthesia complications

## 2014-05-01 NOTE — Interval H&P Note (Signed)
History and Physical Interval Note:  05/01/2014 10:56 AM  [name]  has presented today for surgery, with the diagnosis of OA OF RIGHT HIP  The various methods of treatment have been discussed with the patient and family. After consideration of risks, benefits and other options for treatment, the patient has consented to  Procedure(s): RIGHT TOTAL HIP ARTHROPLASTY ANTERIOR APPROACH (Right) as a surgical intervention .  The patient's history has been reviewed, patient examined, no change in status, stable for surgery.  I have reviewed the patient's chart and labs.  Questions were answered to the patient's satisfaction.     Loanne DrillingALUISIO,Dannis Deroche V

## 2014-05-01 NOTE — H&P (View-Only) (Signed)
[name] DOB: January 10, 1956 Married / Language: English / Race: White Female Date of Admission:  05/01/2014 Chief Complaint:  Right Hip Pain History of Present Illness  The patient is a 58 year old female who comes in for a preoperative History and Physical. The patient is scheduled for a right total hip arthroplasty (anterior approach) to be performed by Dr. Gus Rankin. Aluisio, MD at [facility] on 05/01/2014. The patient is a 58 year old female who presents for follow up of their hip. The patient is being followed for their right hip pain and osteoarthritis. Symptoms reported today include: pain. The patient feels that they are doing poorly and report their pain level to be moderate to severe (usually stays at a 8, can increase w/ any walking to a 10). The following medication has been used for pain control: tramadol, flector patches, lidoderm patches, and voltaren gel. The patient presents today following 4 weeks post intraarticular hip injection. Note for "Follow-up Hip": No relief w/ intraarticular hip injection. No change since last office visit. Pain radiates down to the knee. Unfortunately, the hip is getting progressively worse. She has pain in her groin, lateral hip, radiating down her thigh. It is also hurting in the lower lumbar area. The interarticular injection provided minimal benefit and is no longer working at all. She is getting progressively worse in regards to range of motion also. She has got advanced arthritis of that hip. Unfortunately, an intraarticular injection did not provide any long lasting benefit. At this point, the most predictable means of improving her pain and function is going to be total hip arthroplasty. We did discuss this in detail and she is going to proceed with an anterior total hip. They have been treated conservatively in the past for the above stated problem and despite conservative measures, they continue to have progressive pain and severe functional  limitations and dysfunction. They have failed non-operative management including home exercise, medications, and injections. It is felt that they would benefit from undergoing total joint replacement. Risks and benefits of the procedure have been discussed with the patient and they elect to proceed with surgery. There are no active contraindications to surgery such as ongoing infection or rapidly progressive neurological disease.  Allergies Ceftin *CEPHALOSPORINS* Codeine Sulfate *ANALGESICS - OPIOID* Cymbalta *ANTIDEPRESSANTS*  Problem List/Past Medical  Lumbar pain (724.2) Status post unicompartmental knee replacement, right (V43.65  Z96.651) Osteoarthritis, knee (715.96  M17.9) Peripheral arteriosclerosis (440.20  I70.209) Osteoarthritis of right hip (715.95  M16.11) Anxiety Disorder Bronchitis Past History Migraine Headache Impaired Vision wears glasses Kidney Stone Degenerative Disc Disease Hypothyroidism Non-Insulin Dependent Diabetes Mellitus Osteoporosis Chronic Pain Hyperthyroidism Past History Migraine Headache Hypercholesterolemia Gastroesophageal Reflux Disease Measles Fibrocystic Disease Of Breast Mumps Menopause Hiatal Hernia   Family History Hypertension mother and grandfather mothers side Kidney disease mother Diabetes Mellitus First Degree Relatives. mother and brother Cancer grandmother mothers side Osteoarthritis grandmother mothers side Rheumatoid Arthritis sister and brother Congestive Heart Failure First Degree Relatives. brother Heart Disease father, brother and grandfather mothers side Heart disease in female family member before age 40 Chronic Obstructive Lung Disease brother  Social History  Advance Directives Living Will Current work status working full time Number of flights of stairs before winded 4-5 Drug/Alcohol Rehab (Previously) no Illicit drug use no Drug/Alcohol Rehab (Currently)  no Exercise Exercises never Pain Contract no Children 1 Living situation live with spouse Tobacco use Never smoker. never smoker Marital status married Alcohol use never consumed alcohol Post-Surgical Plans Plan is  to go home.  Medication History  Voltaren (1% Gel, 4 Transdermal applied to affected area qid, Taken starting 04/25/2013) Active. Amaryl (Oral) Specific dose unknown - Active. Pamelor (Oral) Specific dose unknown - Active. Lidoderm (External) Specific dose unknown - Active. Flector (External) Specific dose unknown - Active. Norvasc (Oral) Specific dose unknown - Active. Synthroid (Oral) Specific dose unknown - Active. Toprol XL (Oral) Specific dose unknown - Active. NexIUM (Oral) Specific dose unknown - Active. Zocor (Oral) Specific dose unknown - Active. Glucophage (Oral) Specific dose unknown - Active. Vitamin D (50000UNIT Capsule, Oral) Active. Mobic (15MG  Tablet, Oral) Active. Colace (100MG  Capsule, Oral) Active. ALPRAZolam (0.5MG  Tablet, Oral) Active. TraMADol HCl (50MG  Tablet, Oral) Active.  Past Surgical History D & C Date: 05/1978. Neurostimulator Placement Left Lower Back Date: 02/2009. Breast Mass; Local Excision left Breast Biopsy left Hysterectomy Date: 11/1978. complete (cancerous) Renal Stent Placement Date: 07/2012. Cholecystectomy Date: 08/2012. Arthroscopic Knee Surgery - Right June 2012, December 2013 Colonoscopy Date: 08/2009. Removal Renal Stent Date: 07/2012.   Review of Systems General Not Present- Chills, Fatigue, Fever, Memory Loss, Night Sweats, Weight Gain and Weight Loss. Skin Not Present- Eczema, Hives, Itching, Lesions and Rash. HEENT Not Present- Dentures, Double Vision, Headache, Hearing Loss, Tinnitus and Visual Loss. Respiratory Not Present- Allergies, Chronic Cough, Coughing up blood, Shortness of breath at rest and Shortness of breath with exertion. Cardiovascular Not Present- Chest Pain,  Difficulty Breathing Lying Down, Murmur, Palpitations, Racing/skipping heartbeats and Swelling. Gastrointestinal Not Present- Abdominal Pain, Bloody Stool, Constipation, Diarrhea, Difficulty Swallowing, Heartburn, Jaundice, Loss of appetitie, Nausea and Vomiting. Female Genitourinary Not Present- Blood in Urine, Discharge, Flank Pain, Incontinence, Painful Urination, Urgency, Urinary frequency, Urinary Retention, Urinating at Night and Weak urinary stream. Musculoskeletal Not Present- Back Pain, Joint Pain, Joint Swelling, Morning Stiffness, Muscle Pain, Muscle Weakness and Spasms. Neurological Not Present- Blackout spells, Difficulty with balance, Dizziness, Paralysis, Tremor and Weakness. Psychiatric Not Present- Insomnia.   Vitals Weight: 175 lb Height: 64in Weight was reported by patient. Height was reported by patient. Body Surface Area: 1.89 m Body Mass Index: 30.04 kg/m BP: 112/72 (Sitting, Right Arm, Standard)    Physical Exam General Mental Status -Alert, cooperative and good historian. General Appearance-pleasant, Not in acute distress. Orientation-Oriented X3. Build & Nutrition-Well nourished and Well developed.  Head and Neck Head-normocephalic, atraumatic . Neck Global Assessment - supple, no bruit auscultated on the right, no bruit auscultated on the left.  Eye Vision-Wears corrective lenses. Pupil - Bilateral-Regular and Round. Motion - Bilateral-EOMI.  Chest and Lung Exam Auscultation Breath sounds - clear at anterior chest wall and clear at posterior chest wall. Adventitious sounds - No Adventitious sounds.  Cardiovascular Auscultation Rhythm - Regular rate and rhythm. Heart Sounds - S1 WNL and S2 WNL. Murmurs & Other Heart Sounds - Auscultation of the heart reveals - No Murmurs.  Abdomen Palpation/Percussion Tenderness - Abdomen is non-tender to palpation. Rigidity (guarding) - Abdomen is soft. Auscultation Auscultation of the  abdomen reveals - Bowel sounds normal.  Female Genitourinary Note: Not done, not pertinent to present illness   Musculoskeletal Note: General: She is alert and oriented, no apparent distress.  Musculoskeletal: Her left hip has normal range of motion with no discomfort. Her right hip flexion is 90. No internal rotation, about 20 external rotation, 20 abduction. Her right knee range is 0 to 125 with no tenderness or instability.   Assessment & Plan Osteoarthritis of right hip (715.95  M16.11) Impression: Right Hip Note:Plan is for a Right Total Hip Replacement  by Dr. Lequita HaltAluisio.  [place]  PCP - Dr. Kathlee NationsPaul Eason, [place] - Patient has been seen preoperatively and felt to be stable for surgery.  The patient does not have any contraindications and will receive TXA (tranexamic acid) prior to surgery.  Please note that the patient does get nauseated with anesthesia.   Signed electronically by Beckey RutterAlezandrew L Kindrick Lankford, III PA-C

## 2014-05-01 NOTE — Progress Notes (Signed)
PT Cancellation Note  Patient Details Name: [name] MRN: [id number] DOB: 07-28-1956   Cancelled Treatment:    Reason Eval/Treat Not Completed: Pain limiting ability to participate   [name], [name] 05/01/2014, 5:06 PM

## 2014-05-01 NOTE — Anesthesia Postprocedure Evaluation (Signed)
  Anesthesia Post-op Note  Patient: [name]  Procedure(s) Performed: Procedure(s) (LRB): RIGHT TOTAL HIP ARTHROPLASTY ANTERIOR APPROACH (Right)  Patient Location: PACU  Anesthesia Type: General  Level of Consciousness: awake and alert   Airway and Oxygen Therapy: Patient Spontanous Breathing  Post-op Pain: mild  Post-op Assessment: Post-op Vital signs reviewed, Patient's Cardiovascular Status Stable, Respiratory Function Stable, Patent Airway and No signs of Nausea or vomiting  Last Vitals:  Filed Vitals:   05/01/14 1426  BP: 123/73  Pulse: 80  Temp: 36.6 C  Resp: 12    Post-op Vital Signs: stable   Complications: No apparent anesthesia complications

## 2014-05-01 NOTE — Anesthesia Preprocedure Evaluation (Addendum)
Anesthesia Evaluation  Patient identified by MRN, date of birth, ID band Patient awake    Reviewed: Allergy & Precautions, H&P , NPO status , Patient's Chart, lab work & pertinent test results  Airway Mallampati: II TM Distance: >3 FB Neck ROM: Full    Dental no notable dental hx.    Pulmonary neg pulmonary ROS,  breath sounds clear to auscultation  Pulmonary exam normal       Cardiovascular negative cardio ROS  + dysrhythmias Supra Ventricular Tachycardia Rhythm:Regular Rate:Normal     Neuro/Psych negative neurological ROS  negative psych ROS   GI/Hepatic Neg liver ROS, GERD-  ,  Endo/Other  diabetes  Renal/GU negative Renal ROS  negative genitourinary   Musculoskeletal negative musculoskeletal ROS (+)   Abdominal   Peds negative pediatric ROS (+)  Hematology negative hematology ROS (+)   Anesthesia Other Findings   Reproductive/Obstetrics negative OB ROS                          Anesthesia Physical Anesthesia Plan  ASA: II  Anesthesia Plan: General   Post-op Pain Management:    Induction: Intravenous  Airway Management Planned: Oral ETT  Additional Equipment:   Intra-op Plan:   Post-operative Plan: Extubation in OR  Informed Consent: I have reviewed the patients History and Physical, chart, labs and discussed the procedure including the risks, benefits and alternatives for the proposed anesthesia with the patient or authorized representative who has indicated his/her understanding and acceptance.   Dental advisory given  Plan Discussed with: CRNA and Surgeon  Anesthesia Plan Comments:         Anesthesia Quick Evaluation

## 2014-05-02 DIAGNOSIS — D62 Acute posthemorrhagic anemia: Secondary | ICD-10-CM | POA: Diagnosis not present

## 2014-05-02 LAB — CBC
HCT: 26.3 % — ABNORMAL LOW (ref 36.0–46.0)
Hemoglobin: 8.9 g/dL — ABNORMAL LOW (ref 12.0–15.0)
MCH: 31.7 pg (ref 26.0–34.0)
MCHC: 33.8 g/dL (ref 30.0–36.0)
MCV: 93.6 fL (ref 78.0–100.0)
PLATELETS: 216 10*3/uL (ref 150–400)
RBC: 2.81 MIL/uL — ABNORMAL LOW (ref 3.87–5.11)
RDW: 12.7 % (ref 11.5–15.5)
WBC: 8.5 10*3/uL (ref 4.0–10.5)

## 2014-05-02 LAB — BASIC METABOLIC PANEL
ANION GAP: 9 (ref 5–15)
BUN: 12 mg/dL (ref 6–23)
CO2: 27 mEq/L (ref 19–32)
CREATININE: 0.95 mg/dL (ref 0.50–1.10)
Calcium: 8.7 mg/dL (ref 8.4–10.5)
Chloride: 102 mEq/L (ref 96–112)
GFR, EST AFRICAN AMERICAN: 76 mL/min — AB (ref >90)
GFR, EST NON AFRICAN AMERICAN: 66 mL/min — AB (ref >90)
Glucose, Bld: 161 mg/dL — ABNORMAL HIGH (ref 70–99)
Potassium: 4.7 mEq/L (ref 3.7–5.3)
Sodium: 138 mEq/L (ref 137–147)

## 2014-05-02 LAB — GLUCOSE, CAPILLARY
GLUCOSE-CAPILLARY: 83 mg/dL (ref 70–99)
GLUCOSE-CAPILLARY: 278 mg/dL — AB (ref 70–99)
Glucose-Capillary: 116 mg/dL — ABNORMAL HIGH (ref 70–99)
Glucose-Capillary: 248 mg/dL — ABNORMAL HIGH (ref 70–99)

## 2014-05-02 MED ORDER — TRAMADOL HCL 50 MG PO TABS
50.0000 mg | ORAL_TABLET | Freq: Four times a day (QID) | ORAL | Status: DC | PRN
  Administered 2014-05-02: 100 mg via ORAL
  Filled 2014-05-02 (×2): qty 2

## 2014-05-02 MED ORDER — SODIUM CHLORIDE 0.9 % IV BOLUS (SEPSIS)
250.0000 mL | Freq: Once | INTRAVENOUS | Status: AC
  Administered 2014-05-02: 250 mL via INTRAVENOUS

## 2014-05-02 MED ORDER — POLYSACCHARIDE IRON COMPLEX 150 MG PO CAPS
150.0000 mg | ORAL_CAPSULE | Freq: Every day | ORAL | Status: DC
  Administered 2014-05-02 – 2014-05-03 (×2): 150 mg via ORAL
  Filled 2014-05-02 (×2): qty 1

## 2014-05-02 MED ORDER — NON FORMULARY: 40.0000 mg | Freq: Every day | Status: DC

## 2014-05-02 MED ORDER — ESOMEPRAZOLE MAGNESIUM 40 MG PO CPDR
40.0000 mg | DELAYED_RELEASE_CAPSULE | Freq: Every day | ORAL | Status: DC
  Administered 2014-05-02 – 2014-05-03 (×2): 40 mg via ORAL
  Filled 2014-05-02 (×2): qty 1

## 2014-05-02 NOTE — Progress Notes (Signed)
Physical Therapy Treatment Patient Details Name: Akshita Italiano MRN: [id number] DOB: 1956/08/19 Today's Date: 05/02/2014    History of Present Illness s/p R DATHR    PT Comments    Progressing well and hoping for d/c tomorrow  Follow Up Recommendations  Home health PT     Equipment Recommendations  None recommended by PT    Recommendations for Other Services OT consult     Precautions / Restrictions Precautions Precautions: Fall Restrictions Weight Bearing Restrictions: No Other Position/Activity Restrictions: WBAT    Mobility  Bed Mobility Overal bed mobility: Needs Assistance Bed Mobility: Sit to Supine       Sit to supine: Min assist   General bed mobility comments: cues for sequence and use of L LE to self assist  Transfers Overall transfer level: Needs assistance Equipment used: Rolling walker (2 wheeled) Transfers: Sit to/from Stand Sit to Stand: Min assist         General transfer comment: cues for LE management and use of UEs to self assist  Ambulation/Gait Ambulation/Gait assistance: Min assist;Min guard Ambulation Distance (Feet): 123 Feet Assistive device: Rolling walker (2 wheeled) Gait Pattern/deviations: Step-to pattern;Decreased step length - right;Decreased step length - left;Shuffle;Trunk flexed     General Gait Details: cues for posture, position from RW and initial sequence   Stairs            Wheelchair Mobility    Modified Rankin (Stroke Patients Only)       Balance                                    Cognition Arousal/Alertness: Awake/alert Behavior During Therapy: WFL for tasks assessed/performed Overall Cognitive Status: Within Functional Limits for tasks assessed                      Exercises      General Comments        Pertinent Vitals/Pain Pain Assessment: 0-10 Pain Score: 3  Pain Location: R hip  Pain Descriptors / Indicators: Aching;Burning Pain Intervention(s): Limited  activity within patient's tolerance;Ice applied;Premedicated before session    Home Living Family/patient expects to be discharged to:: Private residence Living Arrangements: Spouse/significant other Available Help at Discharge: Family;Available 24 hours/day Type of Home: House Home Access: Stairs to enter   Home Layout: Able to live on main level with bedroom/bathroom Home Equipment: Dan Humphreys - 2 wheels;Cane - single point;Shower seat;Grab bars - tub/shower;Adaptive equipment      Prior Function Level of Independence: Independent;Independent with assistive device(s)          PT Goals (current goals can now be found in the care plan section) Acute Rehab PT Goals Patient Stated Goal: Resume previous lifestyle with decreased pain PT Goal Formulation: With patient Time For Goal Achievement: 05/09/14 Potential to Achieve Goals: Good Progress towards PT goals: Progressing toward goals    Frequency  7X/week    PT Plan Current plan remains appropriate    Co-evaluation             End of Session Equipment Utilized During Treatment: Gait belt Activity Tolerance: Patient tolerated treatment well Patient left: in bed;with call bell/phone within reach;with family/visitor present     Time: 1520-1540 PT Time Calculation (min): 20 min  Charges:  $Gait Training: 8-22 mins                    G Codes:  Sharma Lawrance 05/02/2014, 3:44 PM

## 2014-05-02 NOTE — Evaluation (Signed)
Occupational Therapy Evaluation Patient Details Name: [name] MRN: [id number] DOB: 06/01/1956 Today's Date: 05/02/2014    History of Present Illness s/p R DATHR   Clinical Impression   Pt was assisted for LB dressing and used DME for mobility and showering prior to admission.  Educated pt in use of sock aide and reacher for independent LB dressing.  Pt agreeable to practice of tub transfer with [facility] therapist and husband supervising. Pt has no concerns with return home having had knee surgery one year ago. Will have husband available to assist as needed. No further OT needs.    Follow Up Recommendations  No OT follow up, Home Health Aide   Equipment Recommendations  None recommended by OT    Recommendations for Other Services       Precautions / Restrictions Precautions Precautions: Fall Restrictions Weight Bearing Restrictions: No Other Position/Activity Restrictions: WBAT      Mobility Bed Mobility Overal bed mobility: Needs Assistance Bed Mobility: Supine to Sit     Supine to sit: Mod assist     General bed mobility comments: not assessed, pt up in recliner  Transfers Overall transfer level: Needs assistance Equipment used: Rolling walker (2 wheeled) Transfers: Sit to/from Stand Sit to Stand: Min assist         General transfer comment: cues for LE management and use of UEs to self assist    Balance                                            ADL Overall ADL's : Needs assistance/impaired Eating/Feeding: Independent;Sitting   Grooming: Wash/dry hands;Min guard;Standing   Upper Body Bathing: Set up;Sitting   Lower Body Bathing: Moderate assistance;Sit to/from stand   Upper Body Dressing : Set up;Sitting   Lower Body Dressing: Moderate assistance;Sit to/from stand   Toilet Transfer: Minimal assistance;RW;Stand-pivot;BSC   Toileting- Architect and Hygiene: Min guard;Sit to/from stand       Functional mobility  during ADLs: Minimal assistance;Rolling walker General ADL Comments: Pt required assist for donning shoes and socks prior to admission.  Educated pt in use of sock aide and reacher, safe footwear.     Vision                     Perception     Praxis      Pertinent Vitals/Pain Pain Assessment: 0-10 Pain Score: 3  Pain Location: R hip Pain Descriptors / Indicators: Operative site guarding Pain Intervention(s): Ice applied;Repositioned;Premedicated before session     Hand Dominance Right   Extremity/Trunk Assessment Upper Extremity Assessment Upper Extremity Assessment: Overall WFL for tasks assessed   Lower Extremity Assessment Lower Extremity Assessment: Defer to PT evaluation LLE Deficits / Details: Hip strength 2+/5 with AAROM at hip to 95 flex and 15 abd       Communication Communication Communication: No difficulties   Cognition Arousal/Alertness: Awake/alert Behavior During Therapy: WFL for tasks assessed/performed Overall Cognitive Status: Within Functional Limits for tasks assessed                     General Comments       Exercises Exercises: Total Joint     Shoulder Instructions      Home Living Family/patient expects to be discharged to:: Private residence Living Arrangements: Spouse/significant other Available Help at Discharge: Family;Available 24 hours/day Type of Home:  House Home Access: Stairs to enter Enter[employer] CorporationEntrance Stairs-Number of Steps: 1   Home Layout: Able to live on main level with bedroom/bathroom     Bathroom Shower/Tub: Chief Strategy OfficerTub/shower unit   Bathroom Toilet: Standard (has sink and ceramic toilet paper holder to push up from)     Home Equipment: Walker - 2 wheels;Cane - single point;Shower seat;Grab bars - tub/shower;Adaptive equipment Adaptive Equipment: Reacher;Long-handled shoe horn;Long-handled sponge        Prior Functioning/Environment Level of Independence: Independent;Independent with assistive device(s)              OT Diagnosis:     OT Problem List:     OT Treatment/Interventions:      OT Goals(Current goals can be found in the care plan section) Acute Rehab OT Goals Patient Stated Goal: Resume previous lifestyle with decreased pain  OT Frequency:     Barriers to D/C:            Co-evaluation              End of Session Equipment Utilized During Treatment: Gait belt;Rolling walker  Activity Tolerance: Patient tolerated treatment well Patient left: in chair;with call bell/phone within reach;with family/visitor present   Time: [id number] OT Time Calculation (min): 22 min Charges:  OT General Charges $OT Visit: 1 Procedure OT Evaluation $Initial OT Evaluation Tier I: 1 Procedure OT Treatments $Self Care/Home Management : 8-22 mins G-Codes:    Evern BioMayberry, Bain Whichard Lynn 05/02/2014, 1:42 PM [phone]

## 2014-05-02 NOTE — Plan of Care (Signed)
Problem: Phase III Progression Outcomes Goal: Anticoagulant follow-up in place Outcome: Not Applicable Date Met:  32/76/14 Xarelto VTE, no f/u needed.

## 2014-05-02 NOTE — Evaluation (Signed)
Physical Therapy Evaluation Patient Details Name: [name] MRN: [id number] DOB: 04/14/1956 Today's Date: 05/02/2014   History of Present Illness     Clinical Impression  Pt s/p L THR presents with decreased L LE strength/ROM and post op pain limiting functional mobility.  Pt should progress to d/c home with family assist and HHPT follow up.    Follow Up Recommendations Home health PT    Equipment Recommendations  None recommended by PT    Recommendations for Other Services OT consult     Precautions / Restrictions Precautions Precautions: Fall Restrictions Weight Bearing Restrictions: No Other Position/Activity Restrictions: WBAT      Mobility  Bed Mobility Overal bed mobility: Needs Assistance Bed Mobility: Supine to Sit     Supine to sit: Mod assist     General bed mobility comments: cues for sequence and use of L LE to self assist  Transfers Overall transfer level: Needs assistance Equipment used: Rolling walker (2 wheeled) Transfers: Sit to/from Stand Sit to Stand: Min assist;Mod assist         General transfer comment: cues for LE management and use of UEs to self assist  Ambulation/Gait Ambulation/Gait assistance: Min assist Ambulation Distance (Feet): 38 Feet Assistive device: Rolling walker (2 wheeled) Gait Pattern/deviations: Step-to pattern;Decreased step length - right;Decreased step length - left;Shuffle;Trunk flexed Gait velocity: decr   General Gait Details: cues for posture, position from RW and initial sequence  Stairs            Wheelchair Mobility    Modified Rankin (Stroke Patients Only)       Balance                                             Pertinent Vitals/Pain 7/10; premed, ice pack provided    Home Living Family/patient expects to be discharged to:: Private residence Living Arrangements: Spouse/significant other Available Help at Discharge: Family Type of Home: House Home Access:  Stairs to enter   Secretary/administrator of Steps: 1 Home Layout: Able to live on main level with bedroom/bathroom Home Equipment: Walker - 2 wheels;Cane - single point;Shower seat      Prior Function Level of Independence: Independent;Independent with assistive device(s)               Hand Dominance        Extremity/Trunk Assessment   Upper Extremity Assessment: Overall WFL for tasks assessed           Lower Extremity Assessment: LLE deficits/detail   LLE Deficits / Details: Hip strength 2+/5 with AAROM at hip to 95 flex and 15 abd     Communication   Communication: No difficulties  Cognition Arousal/Alertness: Awake/alert Behavior During Therapy: WFL for tasks assessed/performed Overall Cognitive Status: Within Functional Limits for tasks assessed                      General Comments      Exercises Total Joint Exercises Ankle Circles/Pumps: AROM;Both;15 reps;Supine Quad Sets: AROM;Both;10 reps;Supine Heel Slides: AAROM;Right;15 reps;Supine Hip ABduction/ADduction: AAROM;Right;10 reps;Supine      Assessment/Plan    PT Assessment Patient needs continued PT services  PT Diagnosis Difficulty walking   PT Problem List Decreased strength;Decreased range of motion;Decreased activity tolerance;Decreased mobility;Decreased knowledge of use of DME;Pain  PT Treatment Interventions DME instruction;Gait training;Stair training;Functional mobility training;Therapeutic activities;Therapeutic exercise;Patient/family education  PT Goals (Current goals can be found in the Care Plan section) Acute Rehab PT Goals Patient Stated Goal: Resume previous lifestyle with decreased pain PT Goal Formulation: With patient Time For Goal Achievement: 05/09/14 Potential to Achieve Goals: Good    Frequency 7X/week   Barriers to discharge        Co-evaluation               End of Session Equipment Utilized During Treatment: Gait belt Activity Tolerance:  Patient tolerated treatment well Patient left: in chair;with call bell/phone within reach;with family/visitor present Nurse Communication: Mobility status         Time: [id number] PT Time Calculation (min): 32 min   Charges:   PT Evaluation $Initial PT Evaluation Tier I: 1 Procedure PT Treatments $Gait Training: 8-22 mins $Therapeutic Exercise: 8-22 mins   PT G Codes:          [name] 05/02/2014, 12:47 PM

## 2014-05-02 NOTE — Discharge Instructions (Addendum)
°Dr. Frank Aluisio °Total Joint Specialist °[facility] °[address] °[place], [place] [address] °[phone] ° ° ° °ANTERIOR APPROACH TOTAL HIP REPLACEMENT POSTOPERATIVE DIRECTIONS ° ° °Hip Rehabilitation, Guidelines Following Surgery  °The results of a hip operation are greatly improved after range of motion and muscle strengthening exercises. Follow all safety measures which are given to protect your hip. If any of these exercises cause increased pain or swelling in your joint, decrease the amount until you are comfortable again. Then slowly increase the exercises. Call your caregiver if you have problems or questions.  °HOME CARE INSTRUCTIONS  °Most of the following instructions are designed to prevent the dislocation of your new hip.  °Remove items at home which could result in a fall. This includes throw rugs or furniture in walking pathways.  °Continue medications as instructed at time of discharge. °· You may have some home medications which will be placed on hold until you complete the course of blood thinner medication. °· You may start showering once you are discharged home but do not submerge the incision under water. Just pat the incision dry and apply a dry gauze dressing on daily. °Do not put on socks or shoes without following the instructions of your caregivers.  °Sit on high chairs which makes it easier to stand.  °Sit on chairs with arms. Use the chair arms to help push yourself up when arising.  °Keep your leg on the side of the operation out in front of you when standing up.  °Arrange for the use of a toilet seat elevator so you are not sitting low.   °· Walk with walker as instructed.  °You may resume a sexual relationship in one month or when given the OK by your caregiver.  °Use walker as long as suggested by your caregivers.  °You may put full weight on your legs and walk as much as is comfortable. °Avoid periods of inactivity such as sitting longer than an hour  when not asleep. This helps prevent blood clots.  °You may return to work once you are cleared by your surgeon.  °Do not drive a car for 6 weeks or until released by your surgeon.  °Do not drive while taking narcotics.  °Wear elastic stockings for three weeks following surgery during the day but you may remove then at night.  °Make sure you keep all of your appointments after your operation with all of your doctors and caregivers. You should call the office at the above phone number and make an appointment for approximately two weeks after the date of your surgery. °Change the dressing daily and reapply a dry dressing each time. °Please pick up a stool softener and laxative for home use as long as you are requiring pain medications. °· Continue to use ice on the hip for pain and swelling from surgery. You may notice swelling that will progress down to the foot and ankle.  This is normal after  surgery.  Elevate the leg when you are not up walking on it.   °It is important for you to complete the blood thinner medication as prescribed by your doctor. °· Continue to use the breathing machine which will help keep your temperature down.  It is common for your temperature to cycle up and down following surgery, especially at night when you are not up moving around and exerting yourself.  The breathing machine keeps your lungs expanded and your temperature down. ° °RANGE OF MOTION AND STRENGTHENING EXERCISES  °  These exercises are designed to help you keep full movement of your hip joint. Follow your caregiver's or physical therapist's instructions. Perform all exercises about fifteen times, three times per day or as directed. Exercise both hips, even if you have had only one joint replacement. These exercises can be done on a training (exercise) mat, on the floor, on a table or on a bed. Use whatever works the best and is most comfortable for you. Use music or television while you are exercising so that the exercises are  a pleasant break in your day. This will make your life better with the exercises acting as a break in routine you can look forward to.  °Lying on your back, slowly slide your foot toward your buttocks, raising your knee up off the floor. Then slowly slide your foot back down until your leg is straight again.  °Lying on your back spread your legs as far apart as you can without causing discomfort.  °Lying on your side, raise your upper leg and foot straight up from the floor as far as is comfortable. Slowly lower the leg and repeat.  °Lying on your back, tighten up the muscle in the front of your thigh (quadriceps muscles). You can do this by keeping your leg straight and trying to raise your heel off the floor. This helps strengthen the largest muscle supporting your knee.  °Lying on your back, tighten up the muscles of your buttocks both with the legs straight and with the knee bent at a comfortable angle while keeping your heel on the floor.  ° °SKILLED REHAB INSTRUCTIONS: °If the patient is transferred to a skilled rehab facility following release from the hospital, a list of the current medications will be sent to the facility for the patient to continue.  When discharged from the skilled rehab facility, please have the facility set up the patient's Home Health Physical Therapy prior to being released. Also, the skilled facility will be responsible for providing the patient with their medications at time of release from the facility to include their pain medication, the muscle relaxants, and their blood thinner medication. If the patient is still at the rehab facility at time of the two week follow up appointment, the skilled rehab facility will also need to assist the patient in arranging follow up appointment in our office and any transportation needs. ° °MAKE SURE YOU:  °Understand these instructions.  °Will watch your condition.  °Will get help right away if you are not doing well or get worse. ° °Pick up  stool softner and laxative for home. °Do not submerge incision under water. °May shower. °Continue to use ice for pain and swelling from surgery. °Total Hip Protocol. ° °Take Xarelto for two and a half more weeks, then discontinue Xarelto. °Once the patient has completed the blood thinner regimen, then take a Baby 81 mg Aspirin daily for three more weeks. ° °Information on my medicine - XARELTO® (Rivaroxaban) ° °This medication education was reviewed with me or my healthcare representative as part of my discharge preparation.  The pharmacist that spoke with me during my hospital stay was:  Absher, Randall K, RPH ° °Why was Xarelto® prescribed for you? °Xarelto® was prescribed for you to reduce the risk of blood clots forming after orthopedic surgery. The medical term for these abnormal blood clots is venous thromboembolism (VTE). ° °What do you need to know about xarelto® ? °Take your Xarelto® ONCE DAILY at the same time every day. °You may   take it either with or without food. ° °If you have difficulty swallowing the tablet whole, you may crush it and mix in applesauce just prior to taking your dose. ° °Take Xarelto® exactly as prescribed by your doctor and DO NOT stop taking Xarelto® without talking to the doctor who prescribed the medication.  Stopping without other VTE prevention medication to take the place of Xarelto® may increase your risk of developing a clot. ° °After discharge, you should have regular check-up appointments with your healthcare provider that is prescribing your Xarelto®.   ° °What do you do if you miss a dose? °If you miss a dose, take it as soon as you remember on the same day then continue your regularly scheduled once daily regimen the next day. Do not take two doses of Xarelto® on the same day.  ° °Important Safety Information °A possible side effect of Xarelto® is bleeding. You should call your healthcare provider right away if you experience any of the following: °  Bleeding from an  injury or your nose that does not stop. °  Unusual colored urine (red or dark brown) or unusual colored stools (red or black). °  Unusual bruising for unknown reasons. °  A serious fall or if you hit your head (even if there is no bleeding). ° °Some medicines may interact with Xarelto® and might increase your risk of bleeding while on Xarelto®. To help avoid this, consult your healthcare provider or pharmacist prior to using any new prescription or non-prescription medications, including herbals, vitamins, non-steroidal anti-inflammatory drugs (NSAIDs) and supplements. ° °This website has more information on Xarelto®: www.xarelto.com. ° ° °

## 2014-05-02 NOTE — Plan of Care (Signed)
Problem: Consults Goal: Diagnosis- Total Joint Replacement Outcome: Completed/Met Date Met:  05/02/14 Primary Total Hip RIGHT, Anterior

## 2014-05-02 NOTE — Progress Notes (Signed)
Subjective: 1 Day Post-Op Procedure(s) (LRB): RIGHT TOTAL HIP ARTHROPLASTY ANTERIOR APPROACH (Right) Patient reports pain as mild.   Patient seen in rounds by Dr. Lequita HaltAluisio. Patient is having problems with low pressues.  Given fluid bolus this morning and recheck BP. We will start therapy today. Will see how she does getting up with PT. HGB is 8.9.  Started at 11.4. [place].  Objective: Vital signs in last 24 hours: Temp:  [97.8 F (36.6 C)-98.8 F (37.1 C)] 98.2 F (36.8 C) (08/06 0537) Pulse Rate:  [67-99] 70 (08/06 0537) Resp:  [12-18] 16 (08/06 0537) BP: (91-136)/(52-79) 91/54 mmHg (08/06 0537) SpO2:  [95 %-99 %] 96 % (08/06 0537) FiO2 (%):  [2 %] 2 % (08/05 1426) Weight:  [77.565 kg (171 lb)] 77.565 kg (171 lb) (08/05 1426)  Intake/Output from previous day:  Intake/Output Summary (Last 24 hours) at 05/02/14 0734 Last data filed at 05/02/14 0600  Gross per 24 hour  Intake 4054.25 ml  Output   3720 ml  Net 334.25 ml    Intake/Output this shift:    Labs:  Recent Labs  05/02/14 0453  HGB 8.9*    Recent Labs  05/02/14 0453  WBC 8.5  RBC 2.81*  HCT 26.3*  PLT 216    Recent Labs  05/02/14 0453  NA 138  K 4.7  CL 102  CO2 27  BUN 12  CREATININE 0.95  GLUCOSE 161*  CALCIUM 8.7   No results found for this basename: LABPT, INR,  in the last 72 hours  EXAM General - Patient is Alert, Appropriate and Oriented Extremity - Neurovascular intact Sensation intact distally Dorsiflexion/Plantar flexion intact Dressing - dressing C/D/I Motor Function - intact, moving foot and toes well on exam.  Hemovac pulled without difficulty.  Past Medical History  Diagnosis Date  . Acute medial meniscal injury of knee right  . Borderline diabetes   . Hypothyroidism   . GERD (gastroesophageal reflux disease)   . H/O hiatal hernia   . Facet joint disease of lumbosacral region   . Arthritis of knee  bilateral   . Spinal cord stimulator status placement in 2010    left posterior flank  . Chronic back pain   . Rapid palpitations controlled w/ metoprolol  . Normal cardiac stress test 5 yrs ago  . Normal echocardiogram 5 yrs ago  . PONV (postoperative nausea and vomiting)   . Hyperlipidemia   . Dysrhythmia     PALPITATIONS CONTROLLED WITH METOPROLOL  . Cluster headaches     TAKES NORVASC FOR HEADACHES  . Bronchitis 05-24-13    maybe once per year-denies asthma  . History of kidney stones   . Diabetes mellitus without complication     borderline   . Anxiety   . Kidney stones     2013     Assessment/Plan: 1 Day Post-Op Procedure(s) (LRB): RIGHT TOTAL HIP ARTHROPLASTY ANTERIOR APPROACH (Right) Principal Problem:   OA (osteoarthritis) of hip Active Problems:   Postoperative anemia due to acute blood loss  Estimated body mass index is 29.34 kg/(m^2) as calculated from the following:   Height as of this encounter: 5\' 4"  (1.626 m).   Weight as of this encounter: 77.565 kg (171 lb). Advance diet Up with therapy Discharge home with home health Fluids this morning. Recheck BP.  DVT Prophylaxis - Xarelto Weight Bearing As Tolerated right Leg Hemovac Pulled Begin Therapy  Avel Peacerew Perseus Westall, PA-C Orthopaedic  Surgery 05/02/2014, 7:34 AM

## 2014-05-03 LAB — BASIC METABOLIC PANEL
Anion gap: 7 (ref 5–15)
BUN: 10 mg/dL (ref 6–23)
CHLORIDE: 102 meq/L (ref 96–112)
CO2: 29 meq/L (ref 19–32)
Calcium: 8.8 mg/dL (ref 8.4–10.5)
Creatinine, Ser: 0.84 mg/dL (ref 0.50–1.10)
GFR calc Af Amer: 88 mL/min — ABNORMAL LOW (ref >90)
GFR calc non Af Amer: 76 mL/min — ABNORMAL LOW (ref >90)
Glucose, Bld: 94 mg/dL (ref 70–99)
POTASSIUM: 4 meq/L (ref 3.7–5.3)
Sodium: 138 mEq/L (ref 137–147)

## 2014-05-03 LAB — CBC
HCT: 26.3 % — ABNORMAL LOW (ref 36.0–46.0)
Hemoglobin: 8.9 g/dL — ABNORMAL LOW (ref 12.0–15.0)
MCH: 31.7 pg (ref 26.0–34.0)
MCHC: 33.8 g/dL (ref 30.0–36.0)
MCV: 93.6 fL (ref 78.0–100.0)
Platelets: 190 10*3/uL (ref 150–400)
RBC: 2.81 MIL/uL — AB (ref 3.87–5.11)
RDW: 13 % (ref 11.5–15.5)
WBC: 8.4 10*3/uL (ref 4.0–10.5)

## 2014-05-03 LAB — GLUCOSE, CAPILLARY
GLUCOSE-CAPILLARY: 71 mg/dL (ref 70–99)
Glucose-Capillary: 97 mg/dL (ref 70–99)

## 2014-05-03 MED ORDER — POLYSACCHARIDE IRON COMPLEX 150 MG PO CAPS: 150.0000 mg | ORAL_CAPSULE | Freq: Every day | ORAL | Status: AC

## 2014-05-03 MED ORDER — METHOCARBAMOL 500 MG PO TABS: 500.0000 mg | ORAL_TABLET | Freq: Four times a day (QID) | ORAL | Status: AC | PRN

## 2014-05-03 MED ORDER — HYDROMORPHONE HCL 2 MG PO TABS: 2.0000 mg | ORAL_TABLET | ORAL | Status: AC | PRN

## 2014-05-03 MED ORDER — TRAMADOL HCL 50 MG PO TABS: 50.0000 mg | ORAL_TABLET | Freq: Four times a day (QID) | ORAL | Status: AC | PRN

## 2014-05-03 MED ORDER — RIVAROXABAN 10 MG PO TABS: 10.0000 mg | ORAL_TABLET | Freq: Every day | ORAL | Status: AC

## 2014-05-03 NOTE — Discharge Summary (Signed)
Physician Discharge Summary   Patient ID: [name] MRN: [id number] DOB/AGE: 57/08/1956 57 y.o.  Admit date: 05/01/2014 Discharge date: 05/03/2014  Primary Diagnosis:  Osteoarthritis of the Right hip.   Admission Diagnoses:  Past Medical History  Diagnosis Date  . Acute medial meniscal injury of knee right  . Borderline diabetes   . Hypothyroidism   . GERD (gastroesophageal reflux disease)   . H/O hiatal hernia   . Facet joint disease of lumbosacral region   . Arthritis of knee bilateral   . Spinal cord stimulator status placement in 2010    left posterior flank  . Chronic back pain   . Rapid palpitations controlled w/ metoprolol  . Normal cardiac stress test 5 yrs ago  . Normal echocardiogram 5 yrs ago  . PONV (postoperative nausea and vomiting)   . Hyperlipidemia   . Dysrhythmia     PALPITATIONS CONTROLLED WITH METOPROLOL  . Cluster headaches     TAKES NORVASC FOR HEADACHES  . Bronchitis 05-24-13    maybe once per year-denies asthma  . History of kidney stones   . Diabetes mellitus without complication     borderline   . Anxiety   . Kidney stones     2013    Discharge Diagnoses:   Principal Problem:   OA (osteoarthritis) of hip Active Problems:   Postoperative anemia due to acute blood loss  Estimated body mass index is 29.34 kg/(m^2) as calculated from the following:   Height as of this encounter: 5' 4"  (1.626 m).   Weight as of this encounter: 77.565 kg (171 lb).  Procedure(s) (LRB): RIGHT TOTAL HIP ARTHROPLASTY ANTERIOR APPROACH (Right)   Consults: None  HPI: [name] is a 58 y.o. female who has advanced end-  stage arthritis of his Right hip with progressively worsening pain and  dysfunction.The patient has failed nonoperative management and presents for  total hip arthroplasty.   Laboratory Data: Admission on 05/01/2014, Discharged on 05/03/2014  Component Date Value Ref Range Status  . Glucose-Capillary 05/01/2014 107* 70 - 99 mg/dL Final   . Comment 1 05/01/2014 Documented in Chart   Final  . Glucose-Capillary 05/01/2014 165* 70 - 99 mg/dL Final  . Comment 1 05/01/2014 Documented in Chart   Final  . Comment 2 05/01/2014 Notify RN   Final  . Glucose-Capillary 05/01/2014 266* 70 - 99 mg/dL Final  . WBC 05/02/2014 8.5  4.0 - 10.5 K/uL Final  . RBC 05/02/2014 2.81* 3.87 - 5.11 MIL/uL Final  . Hemoglobin 05/02/2014 8.9* 12.0 - 15.0 g/dL Final  . HCT 05/02/2014 26.3* 36.0 - 46.0 % Final  . MCV 05/02/2014 93.6  78.0 - 100.0 fL Final  . MCH 05/02/2014 31.7  26.0 - 34.0 pg Final  . MCHC 05/02/2014 33.8  30.0 - 36.0 g/dL Final  . RDW 05/02/2014 12.7  11.5 - 15.5 % Final  . Platelets 05/02/2014 216  150 - 400 K/uL Final  . Sodium 05/02/2014 138  137 - 147 mEq/L Final  . Potassium 05/02/2014 4.7  3.7 - 5.3 mEq/L Final  . Chloride 05/02/2014 102  96 - 112 mEq/L Final  . CO2 05/02/2014 27  19 - 32 mEq/L Final  . Glucose, Bld 05/02/2014 161* 70 - 99 mg/dL Final  . BUN 05/02/2014 12  6 - 23 mg/dL Final  . Creatinine, Ser 05/02/2014 0.95  0.50 - 1.10 mg/dL Final  . Calcium 05/02/2014 8.7  8.4 - 10.5 mg/dL Final  . GFR calc non Af Amer 05/02/2014 66* >  90 mL/min Final  . GFR calc Af Amer 05/02/2014 76* >90 mL/min Final   Comment: (NOTE)                          The eGFR has been calculated using the CKD EPI equation.                          This calculation has not been validated in all clinical situations.                          eGFR's persistently <90 mL/min signify possible Chronic Kidney                          Disease.  . Anion gap 05/02/2014 9  5 - 15 Final  . Glucose-Capillary 05/01/2014 297* 70 - 99 mg/dL Final  . Glucose-Capillary 05/02/2014 116* 70 - 99 mg/dL Final  . Glucose-Capillary 05/02/2014 83  70 - 99 mg/dL Final  . Glucose-Capillary 05/02/2014 248* 70 - 99 mg/dL Final  . WBC 05/03/2014 8.4  4.0 - 10.5 K/uL Final  . RBC 05/03/2014 2.81* 3.87 - 5.11 MIL/uL Final  . Hemoglobin 05/03/2014 8.9* 12.0 - 15.0 g/dL  Final  . HCT 05/03/2014 26.3* 36.0 - 46.0 % Final  . MCV 05/03/2014 93.6  78.0 - 100.0 fL Final  . MCH 05/03/2014 31.7  26.0 - 34.0 pg Final  . MCHC 05/03/2014 33.8  30.0 - 36.0 g/dL Final  . RDW 05/03/2014 13.0  11.5 - 15.5 % Final  . Platelets 05/03/2014 190  150 - 400 K/uL Final  . Sodium 05/03/2014 138  137 - 147 mEq/L Final  . Potassium 05/03/2014 4.0  3.7 - 5.3 mEq/L Final  . Chloride 05/03/2014 102  96 - 112 mEq/L Final  . CO2 05/03/2014 29  19 - 32 mEq/L Final  . Glucose, Bld 05/03/2014 94  70 - 99 mg/dL Final  . BUN 05/03/2014 10  6 - 23 mg/dL Final  . Creatinine, Ser 05/03/2014 0.84  0.50 - 1.10 mg/dL Final  . Calcium 05/03/2014 8.8  8.4 - 10.5 mg/dL Final  . GFR calc non Af Amer 05/03/2014 76* >90 mL/min Final  . GFR calc Af Amer 05/03/2014 88* >90 mL/min Final   Comment: (NOTE)                          The eGFR has been calculated using the CKD EPI equation.                          This calculation has not been validated in all clinical situations.                          eGFR's persistently <90 mL/min signify possible Chronic Kidney                          Disease.  . Anion gap 05/03/2014 7  5 - 15 Final  . Glucose-Capillary 05/02/2014 278* 70 - 99 mg/dL Final  . Comment 1 05/02/2014 Notify RN   Final  . Comment 2 05/02/2014 Documented in Chart   Final  . Glucose-Capillary 05/03/2014 71  70 - 99 mg/dL Final  . Glucose-Capillary 05/03/2014 97  70 - 99 mg/dL Final  Hospital Outpatient Visit on 04/23/2014  Component Date Value Ref Range Status  . aPTT 04/23/2014 30  24 - 37 seconds Final  . WBC 04/23/2014 7.0  4.0 - 10.5 K/uL Final  . RBC 04/23/2014 3.70* 3.87 - 5.11 MIL/uL Final  . Hemoglobin 04/23/2014 11.4* 12.0 - 15.0 g/dL Final  . HCT 04/23/2014 35.1* 36.0 - 46.0 % Final  . MCV 04/23/2014 94.9  78.0 - 100.0 fL Final  . MCH 04/23/2014 30.8  26.0 - 34.0 pg Final  . MCHC 04/23/2014 32.5  30.0 - 36.0 g/dL Final  . RDW 04/23/2014 12.9  11.5 - 15.5 % Final  .  Platelets 04/23/2014 246  150 - 400 K/uL Final  . Sodium 04/23/2014 141  137 - 147 mEq/L Final  . Potassium 04/23/2014 4.3  3.7 - 5.3 mEq/L Final  . Chloride 04/23/2014 102  96 - 112 mEq/L Final  . CO2 04/23/2014 26  19 - 32 mEq/L Final  . Glucose, Bld 04/23/2014 128* 70 - 99 mg/dL Final  . BUN 04/23/2014 14  6 - 23 mg/dL Final  . Creatinine, Ser 04/23/2014 1.00  0.50 - 1.10 mg/dL Final  . Calcium 04/23/2014 9.6  8.4 - 10.5 mg/dL Final  . Total Protein 04/23/2014 6.7  6.0 - 8.3 g/dL Final  . Albumin 04/23/2014 3.7  3.5 - 5.2 g/dL Final  . AST 04/23/2014 38* 0 - 37 U/L Final  . ALT 04/23/2014 37* 0 - 35 U/L Final  . Alkaline Phosphatase 04/23/2014 61  39 - 117 U/L Final  . Total Bilirubin 04/23/2014 0.3  0.3 - 1.2 mg/dL Final  . GFR calc non Af Amer 04/23/2014 62* >90 mL/min Final  . GFR calc Af Amer 04/23/2014 72* >90 mL/min Final   Comment: (NOTE)                          The eGFR has been calculated using the CKD EPI equation.                          This calculation has not been validated in all clinical situations.                          eGFR's persistently <90 mL/min signify possible Chronic Kidney                          Disease.  . Anion gap 04/23/2014 13  5 - 15 Final  . Prothrombin Time 04/23/2014 12.5  11.6 - 15.2 seconds Final  . INR 04/23/2014 0.93  0.00 - 1.49 Final  . ABO/RH(D) 04/23/2014 O NEG   Final  . Antibody Screen 04/23/2014 NEG   Final  . Sample Expiration 04/23/2014 05/04/2014   Final  . Color, Urine 04/23/2014 YELLOW  YELLOW Final  . APPearance 04/23/2014 CLEAR  CLEAR Final  . Specific Gravity, Urine 04/23/2014 1.017  1.005 - 1.030 Final  . pH 04/23/2014 5.5  5.0 - 8.0 Final  . Glucose, UA 04/23/2014 NEGATIVE  NEGATIVE mg/dL Final  . Hgb urine dipstick 04/23/2014 NEGATIVE  NEGATIVE Final  . Bilirubin Urine 04/23/2014 NEGATIVE  NEGATIVE Final  . Ketones, ur 04/23/2014 NEGATIVE  NEGATIVE mg/dL Final  . Protein, ur 04/23/2014 NEGATIVE  NEGATIVE mg/dL Final   . Urobilinogen, UA 04/23/2014 0.2  0.0 - 1.0 mg/dL Final  .  Nitrite 04/23/2014 NEGATIVE  NEGATIVE Final  . Leukocytes, UA 04/23/2014 NEGATIVE  NEGATIVE Final   MICROSCOPIC NOT DONE ON URINES WITH NEGATIVE PROTEIN, BLOOD, LEUKOCYTES, NITRITE, OR GLUCOSE <1000 mg/dL.  Marland Kitchen MRSA, PCR 04/23/2014 NEGATIVE  NEGATIVE Final  . Staphylococcus aureus 04/23/2014 NEGATIVE  NEGATIVE Final   Comment:                                 The Xpert SA Assay (FDA                          approved for NASAL specimens                          in patients over 84 years of age),                          is one component of                          a comprehensive surveillance                          program.  Test performance has                          been validated by [employer] for patients greater                          than or equal to 41 year old.                          It is not intended                          to diagnose infection nor to                          guide or monitor treatment.     X-Rays:Dg Chest 2 View  04/23/2014   CLINICAL DATA:  Preop osteoarthritis a right hip  EXAM: CHEST  2 VIEW  COMPARISON:  None  FINDINGS: The heart size and mediastinal contours are within normal limits. Both lungs are clear. The visualized skeletal structures are unremarkable.  IMPRESSION: No active cardiopulmonary disease.   Electronically Signed   By: Kerby Moors M.D.   On: 04/23/2014 13:09   Dg Hip Complete Right  04/23/2014   CLINICAL DATA:  Right hip osteoarthritis.  EXAM: RIGHT HIP - COMPLETE 2+ VIEW  COMPARISON:  None.  FINDINGS: There is no evidence of hip fracture or dislocation. Moderate narrowing and osteophyte formation is seen involving the right hip joint. Mild narrowing of left hip joint is noted.  IMPRESSION: Moderate degenerative joint disease of the right hip. Flattening of the superior portion of the right femoral head is noted which may represent avascular  necrosis.   Electronically Signed   By: Sabino Dick M.D.   On: 04/23/2014 13:10   Dg Pelvis Portable  05/01/2014  CLINICAL DATA:  Postop hip  EXAM: PORTABLE PELVIS 1-2 VIEWS  COMPARISON:  05/04/2013  FINDINGS: Right total hip arthroplasty has been placed. Anatomic alignment of the osseous and prosthetic structures. No breakage or loosening of the hardware. Moderate degenerative change of the left hip joint.  IMPRESSION: Right total hip arthroplasty anatomically aligned.   Electronically Signed   By: Maryclare Bean M.D.   On: 05/01/2014 13:29   Dg C-arm 1-60 Min-no Report  05/01/2014   CLINICAL DATA: INTRAOP   C-ARM 1-60 MINUTES  Fluoroscopy was utilized by the requesting physician.  No radiographic  interpretation.     EKG: Orders placed during the hospital encounter of 06/04/13  . EKG     Hospital Course: Patient was admitted to [facility] and taken to the OR and underwent the above state procedure without complications.  Patient tolerated the procedure well and was later transferred to the recovery room and then to the orthopaedic floor for postoperative care.  They were given PO and IV analgesics for pain control following their surgery.  They were given 24 hours of postoperative antibiotics of  Anti-infectives   Start     Dose/Rate Route Frequency Ordered Stop   05/01/14 2300  vancomycin (VANCOCIN) IVPB 1000 mg/200 mL premix     1,000 mg 200 mL/hr over 60 Minutes Intravenous Every 12 hours 05/01/14 1435 05/02/14 0001   05/01/14 0813  vancomycin (VANCOCIN) IVPB 1000 mg/200 mL premix     1,000 mg 200 mL/hr over 60 Minutes Intravenous On call to O.R. 05/01/14 0813 05/01/14 1117     and started on DVT prophylaxis in the form of Xarelto.   PT and OT were ordered for total hip protocol.  The patient was allowed to be WBAT with therapy. Discharge planning was consulted to help with postop disposition and equipment needs.  Patient had a tough night on the evening of surgery.  They started to  get up OOB with therapy on day one but had problems with low pressures.  They were given fluid bolus and had pressure rechecked. HGB was down to 8.9 from starting level of 11.4.  Hemovac drain was pulled without difficulty.  Continued to work with therapy into day two.  Dressing was changed on day two and the incision was healing well.  Patient was seen in rounds and was ready to go home.  Blood pressure was 113/61.   Discharge home with home health  Diet - Cardiac diet and Diabetic diet  Follow up - in 2 weeks  Activity - WBAT  Disposition - Home  Condition Upon Discharge - Good  D/C Meds - See DC Summary  DVT Prophylaxis - Xarelto       Discharge Instructions   Call MD / Call 911    Complete by:  As directed   If you experience chest pain or shortness of breath, CALL 911 and be transported to the hospital emergency room.  If you develope a fever above 101 F, pus (white drainage) or increased drainage or redness at the wound, or calf pain, call your surgeon's office.     Change dressing    Complete by:  As directed   You may change your dressing dressing daily with sterile 4 x 4 inch gauze dressing and paper tape.  Do not submerge the incision under water.     Constipation Prevention    Complete by:  As directed   Drink plenty of fluids.  Prune juice may be helpful.  You may use a stool softener, such as Colace (over the counter) 100 mg twice a day.  Use MiraLax (over the counter) for constipation as needed.     Diet - low sodium heart healthy    Complete by:  As directed      Diet general    Complete by:  As directed      Discharge instructions    Complete by:  As directed   Pick up stool softner and laxative for home. Do not submerge incision under water. May shower. Continue to use ice for pain and swelling from surgery. Hip precautions.  Total Hip Protocol.  Take Xarelto for two and a half more weeks, then discontinue Xarelto.     Do not sit on low chairs, stoools or  toilet seats, as it may be difficult to get up from low surfaces    Complete by:  As directed      Driving restrictions    Complete by:  As directed   No driving until released by the physician.     Increase activity slowly as tolerated    Complete by:  As directed      Lifting restrictions    Complete by:  As directed   No lifting until released by the physician.     Patient may shower    Complete by:  As directed   You may shower without a dressing once there is no drainage.  Do not wash over the wound.  If drainage remains, do not shower until drainage stops.     TED hose    Complete by:  As directed   Use stockings (TED hose) for 3 weeks on both leg(s).  You may remove them at night for sleeping.     Weight bearing as tolerated    Complete by:  As directed             Medication List    STOP taking these medications       CALCIUM 600 + D PO     diclofenac 1.3 % Ptch  Commonly known as:  FLECTOR     diclofenac sodium 1 % Gel  Commonly known as:  VOLTAREN     GLUCOSAMINE CHONDROITIN MSM PO     lidocaine 5 %  Commonly known as:  LIDODERM     meloxicam 15 MG tablet  Commonly known as:  MOBIC     multivitamin with minerals Tabs tablet     Omega 3 1000 MG Caps     Vitamin D (Ergocalciferol) 50000 UNITS Caps capsule  Commonly known as:  DRISDOL      TAKE these medications       ALPRAZolam 0.5 MG tablet  Commonly known as:  XANAX  Take 0.5 mg by mouth 2 (two) times daily.     amLODipine 2.5 MG tablet  Commonly known as:  NORVASC  Take 2.5 mg by mouth daily before breakfast. Pt takes this med. For cluster headache's     docusate sodium 100 MG capsule  Commonly known as:  COLACE  Take 100 mg by mouth 2 (two) times daily.     esomeprazole 40 MG capsule  Commonly known as:  NEXIUM  Take 40 mg by mouth daily before supper.     glimepiride 2 MG tablet  Commonly known as:  AMARYL  Take 2 mg by mouth daily with breakfast.     HYDROmorphone 2 MG tablet    Commonly known as:  DILAUDID  Take 1-2 tablets (2-4 mg total) by mouth every 4 (four) hours as needed for severe pain.     iron polysaccharides 150 MG capsule  Commonly known as:  NIFEREX  Take 1 capsule (150 mg total) by mouth daily.     levothyroxine 50 MCG tablet  Commonly known as:  SYNTHROID, LEVOTHROID  Take 50 mcg by mouth daily before breakfast.     metFORMIN 1000 MG tablet  Commonly known as:  GLUCOPHAGE  Take 1,000 mg by mouth 2 (two) times daily with a meal.     methocarbamol 500 MG tablet  Commonly known as:  ROBAXIN  Take 1 tablet (500 mg total) by mouth every 6 (six) hours as needed for muscle spasms.     metoprolol 50 MG tablet  Commonly known as:  LOPRESSOR  Take 50 mg by mouth 2 (two) times daily. Pt takes this med. For palpitations/ tachycardia     nortriptyline 50 MG capsule  Commonly known as:  PAMELOR  Take 100 mg by mouth at bedtime.     rivaroxaban 10 MG Tabs tablet  Commonly known as:  XARELTO  - Take 1 tablet (10 mg total) by mouth daily with breakfast. Take Xarelto for two and a half more weeks, then discontinue Xarelto.  - Once the patient has completed the blood thinner regimen, then take a Baby 81 mg Aspirin daily for three more weeks.     simvastatin 40 MG tablet  Commonly known as:  ZOCOR  Take 20 mg by mouth every evening. Takes 1/2 tablet     traMADol 50 MG tablet  Commonly known as:  ULTRAM  Take 1-2 tablets (50-100 mg total) by mouth every 6 (six) hours as needed (mild pain).       Follow-up Information   Follow up with Gearlean Alf, MD. Schedule an appointment as soon as possible for a visit on 05/16/2014.   Specialty:  Orthopedic Surgery   Contact information:   [address] [place] [address] [phone]       Signed: Arlee Muslim, PA-C Orthopaedic Surgery 05/14/2014, 2:53 PM

## 2014-05-03 NOTE — Progress Notes (Signed)
Physical Therapy Treatment Patient Details Name: Kristen LeafDeborah Nicks MRN: [id number] DOB: Oct 13, 1955 Today's Date: 05/03/2014    History of Present Illness s/p R DATHR    PT Comments      Follow Up Recommendations  Home health PT     Equipment Recommendations  None recommended by PT    Recommendations for Other Services OT consult     Precautions / Restrictions Precautions Precautions: Fall Restrictions Weight Bearing Restrictions: No Other Position/Activity Restrictions: WBAT    Mobility  Bed Mobility Overal bed mobility: Needs Assistance Bed Mobility: Sit to Supine       Sit to supine: Min guard   General bed mobility comments: cues for sequence and use of L LE to self assist  Transfers Overall transfer level: Needs assistance Equipment used: Rolling walker (2 wheeled) Transfers: Sit to/from Stand Sit to Stand: Min assist         General transfer comment: cues for LE management and use of UEs to self assist  Ambulation/Gait Ambulation/Gait assistance: Supervision Ambulation Distance (Feet): 123 Feet Assistive device: Rolling walker (2 wheeled) Gait Pattern/deviations: Step-to pattern;Decreased step length - right;Decreased step length - left;Shuffle;Trunk flexed Gait velocity: decr   General Gait Details: cues for posture, position from RW and initial sequence   Stairs Stairs: Yes Stairs assistance: Min assist Stair Management: No rails;Step to pattern Number of Stairs: 1 General stair comments: cues for sequence and foot/RW placement  Wheelchair Mobility    Modified Rankin (Stroke Patients Only)       Balance                                    Cognition Arousal/Alertness: Awake/alert Behavior During Therapy: WFL for tasks assessed/performed Overall Cognitive Status: Within Functional Limits for tasks assessed                      Exercises Total Joint Exercises Ankle Circles/Pumps: AROM;Both;15  reps;Supine Quad Sets: AROM;Both;10 reps;Supine Gluteal Sets: AROM;Both;10 reps;Supine Heel Slides: AAROM;Right;Supine;20 reps Hip ABduction/ADduction: AAROM;Right;Supine;20 reps    General Comments        Pertinent Vitals/Pain Pain Assessment: 0-10 Pain Score: 3  Pain Location: R hip Pain Descriptors / Indicators: Aching;Burning Pain Intervention(s): Limited activity within patient's tolerance;Premedicated before session;Ice applied    Home Living                      Prior Function            PT Goals (current goals can now be found in the care plan section) Acute Rehab PT Goals Patient Stated Goal: Resume previous lifestyle with decreased pain PT Goal Formulation: With patient Time For Goal Achievement: 05/09/14 Potential to Achieve Goals: Good Progress towards PT goals: Progressing toward goals    Frequency  7X/week    PT Plan Current plan remains appropriate    Co-evaluation             End of Session Equipment Utilized During Treatment: Gait belt Activity Tolerance: Patient tolerated treatment well Patient left: in bed;with call bell/phone within reach;with family/visitor present     Time: 1026-1100 PT Time Calculation (min): 34 min  Charges:  $Gait Training: 8-22 mins $Therapeutic Exercise: 8-22 mins                    G Codes:      Kainen Struckman 05/03/2014, 12:50 PM

## 2014-05-03 NOTE — Progress Notes (Deleted)
[id number]/[facility] [place] [facility]/unable to do physical therapy due to out of network([facility] was faxed information for services on [phone] and did agree to see)/[facility] [place] spoke with [facility]-information faxed to this provider who does agree to see the patient.  Information fAXED to [phone].  Obdulio Mash,RN,BSN,CCM.

## 2014-05-03 NOTE — Plan of Care (Signed)
Problem: Discharge Progression Outcomes Goal: Barriers To Progression Addressed/Resolved Outcome: Completed/Met Date Met:  05/03/14 Hypotensive.  NS bolus given x 2.  BP WNL.

## 2014-05-03 NOTE — Progress Notes (Signed)
CARE MANAGEMENT NOTE 05/03/2014  Patient:  [facility] Hospital[facility]LL,[name]   Account Number:  [id number]  Date Initiated:  05/01/2014  Documentation initiated by:  Sheryll Dymek  Subjective/Objective Assessment:   rt total hip ant.     Action/Plan:   home with hhc   Anticipated DC Date:  05/03/2014   Anticipated DC Plan:  HOME W HOME HEALTH SERVICES  In-house referral  NA      DC Planning Services  CM consult      PAC Choice  NA   Choice offered to / List presented to:  C-1 Patient   DME arranged  NA      DME agency  NA     HH arranged  HH-2 PT      HH agency  OTHER - SEE NOTE   Status of service:  Completed, signed off Medicare Important Message given?  NA - LOS <3 / Initial given by admissions (If response is "NO", the following Medicare IM given date fields will be blank) Date Medicare IM given:   Medicare IM given by:   Date Additional Medicare IM given:   Additional Medicare IM given by:    Discharge Disposition:  HOME W HOME HEALTH SERVICES  Per UR Regulation:  Reviewed for med. necessity/level of care/duration of stay  If discussed at Long Length of Stay Meetings, dates discussed:    Comments:  [id number]/[facility] [place]/unable to do physical therapy due to out of network([facility] was faxed information for services on [phone] and did agree to see)/[facility] [place] spoke with charity-information faxed to this provider who does agree to see the patient.  Information fAXED to [phone]. Bjorn LoserRhonda Lonisha Bobby,RN,BSN,CCM.  [id number]/Reka Wist Earlene Plateravis, RN, BSN, CCM: [phone] Chart reviewed for discharge needs. Next chart review due on [phone].

## 2014-05-03 NOTE — Progress Notes (Signed)
CARE MANAGEMENT NOTE 05/03/2014  [facility]:  [facility]   Account Number:  [id number]  Date Initiated:  05/01/2014  Documentation initiated by:  Danira Nylander  Subjective/Objective Assessment:   rt total hip ant.     Action/Plan:   home with hhc   Anticipated DC Date:  05/03/2014   Anticipated DC Plan:  HOME W HOME HEALTH SERVICES  In-house referral  NA      DC Planning Services  CM consult      [facility]   Choice offered to / List presented to:  C-1 Patient   DME arranged  NA      DME agency  NA     HH arranged  HH-2 PT      [facility]   Status of service:  Completed, signed off Medicare Important Message given?  NA - LOS <3 / Initial given by admissions (If response is "NO", the following Medicare IM given date fields will be blank) Date Medicare IM given:   Medicare IM given by:   Date Additional Medicare IM given:   Additional Medicare IM given by:    Discharge Disposition:  HOME W HOME HEALTH SERVICES  Per UR Regulation:  Reviewed for med. necessity/level of care/duration of stay  If discussed at Long Length of Stay Meetings, dates discussed:    Comments:  [id number]/Analyssa Downs Earlene PlaterDavis, RN, BSN, CCM: [phone] Chart reviewed for discharge needs. Next chart review due on [id number].

## 2014-05-03 NOTE — Progress Notes (Signed)
Subjective: 2 Days Post-Op Procedure(s) (LRB): RIGHT TOTAL HIP ARTHROPLASTY ANTERIOR APPROACH (Right) Patient reports pain as mild.   Patient seen in rounds with Dr. Lequita HaltAluisio. Sitting up in chair. Patient is well, but has had some minor complaints of pain in the hip, requiring pain medications Patient is ready to go home today.  Objective: Vital signs in last 24 hours: Temp:  [98.4 F (36.9 C)-100 F (37.8 C)] 98.9 F (37.2 C) (08/07 0530) Pulse Rate:  [77-116] 77 (08/07 0530) Resp:  [16-20] 20 (08/07 0530) BP: (99-141)/(51-69) 113/61 mmHg (08/07 0530) SpO2:  [89 %-96 %] 93 % (08/07 0530)  Intake/Output from previous day:  Intake/Output Summary (Last 24 hours) at 05/03/14 1029 Last data filed at 05/03/14 0914  Gross per 24 hour  Intake    840 ml  Output   3300 ml  Net  -2460 ml    Intake/Output this shift: Total I/O In: 240 [P.O.:240] Out: -   Labs:  Recent Labs  05/02/14 0453 05/03/14 0500  HGB 8.9* 8.9*    Recent Labs  05/02/14 0453 05/03/14 0500  WBC 8.5 8.4  RBC 2.81* 2.81*  HCT 26.3* 26.3*  PLT 216 190    Recent Labs  05/02/14 0453 05/03/14 0500  NA 138 138  K 4.7 4.0  CL 102 102  CO2 27 29  BUN 12 10  CREATININE 0.95 0.84  GLUCOSE 161* 94  CALCIUM 8.7 8.8   No results found for this basename: LABPT, INR,  in the last 72 hours  EXAM: General - Patient is Alert, Appropriate and Oriented Extremity - Neurovascular intact Sensation intact distally Dorsiflexion/Plantar flexion intact Incision - clean, dry, no drainage Motor Function - intact, moving foot and toes well on exam.   Assessment/Plan: 2 Days Post-Op Procedure(s) (LRB): RIGHT TOTAL HIP ARTHROPLASTY ANTERIOR APPROACH (Right) Procedure(s) (LRB): RIGHT TOTAL HIP ARTHROPLASTY ANTERIOR APPROACH (Right) Past Medical History  Diagnosis Date  . Acute medial meniscal injury of knee right  . Borderline diabetes   . Hypothyroidism   . GERD (gastroesophageal reflux disease)   .  H/O hiatal hernia   . Facet joint disease of lumbosacral region   . Arthritis of knee bilateral   . Spinal cord stimulator status placement in 2010    left posterior flank  . Chronic back pain   . Rapid palpitations controlled w/ metoprolol  . Normal cardiac stress test 5 yrs ago  . Normal echocardiogram 5 yrs ago  . PONV (postoperative nausea and vomiting)   . Hyperlipidemia   . Dysrhythmia     PALPITATIONS CONTROLLED WITH METOPROLOL  . Cluster headaches     TAKES NORVASC FOR HEADACHES  . Bronchitis 05-24-13    maybe once per year-denies asthma  . History of kidney stones   . Diabetes mellitus without complication     borderline   . Anxiety   . Kidney stones     2013    Principal Problem:   OA (osteoarthritis) of hip Active Problems:   Postoperative anemia due to acute blood loss  Estimated body mass index is 29.34 kg/(m^2) as calculated from the following:   Height as of this encounter: 5\' 4"  (1.626 m).   Weight as of this encounter: 77.565 kg (171 lb). Up with therapy Discharge home with home health Diet - Cardiac diet and Diabetic diet Follow up - in 2 weeks Activity - WBAT Disposition - Home Condition Upon Discharge - Good D/C Meds - See DC Summary DVT Prophylaxis - Xarelto  Avel Peace, PA-C Orthopaedic Surgery 05/03/2014, 10:29 AM

## 2014-10-03 IMAGING — CR DG CHEST 2V
2 series · 2 of 2 positions shown · non-contrast
Comparison: None

CLINICAL DATA: Preop osteoarthritis a right hip

EXAM:
CHEST  2 VIEW

[w chest pa]
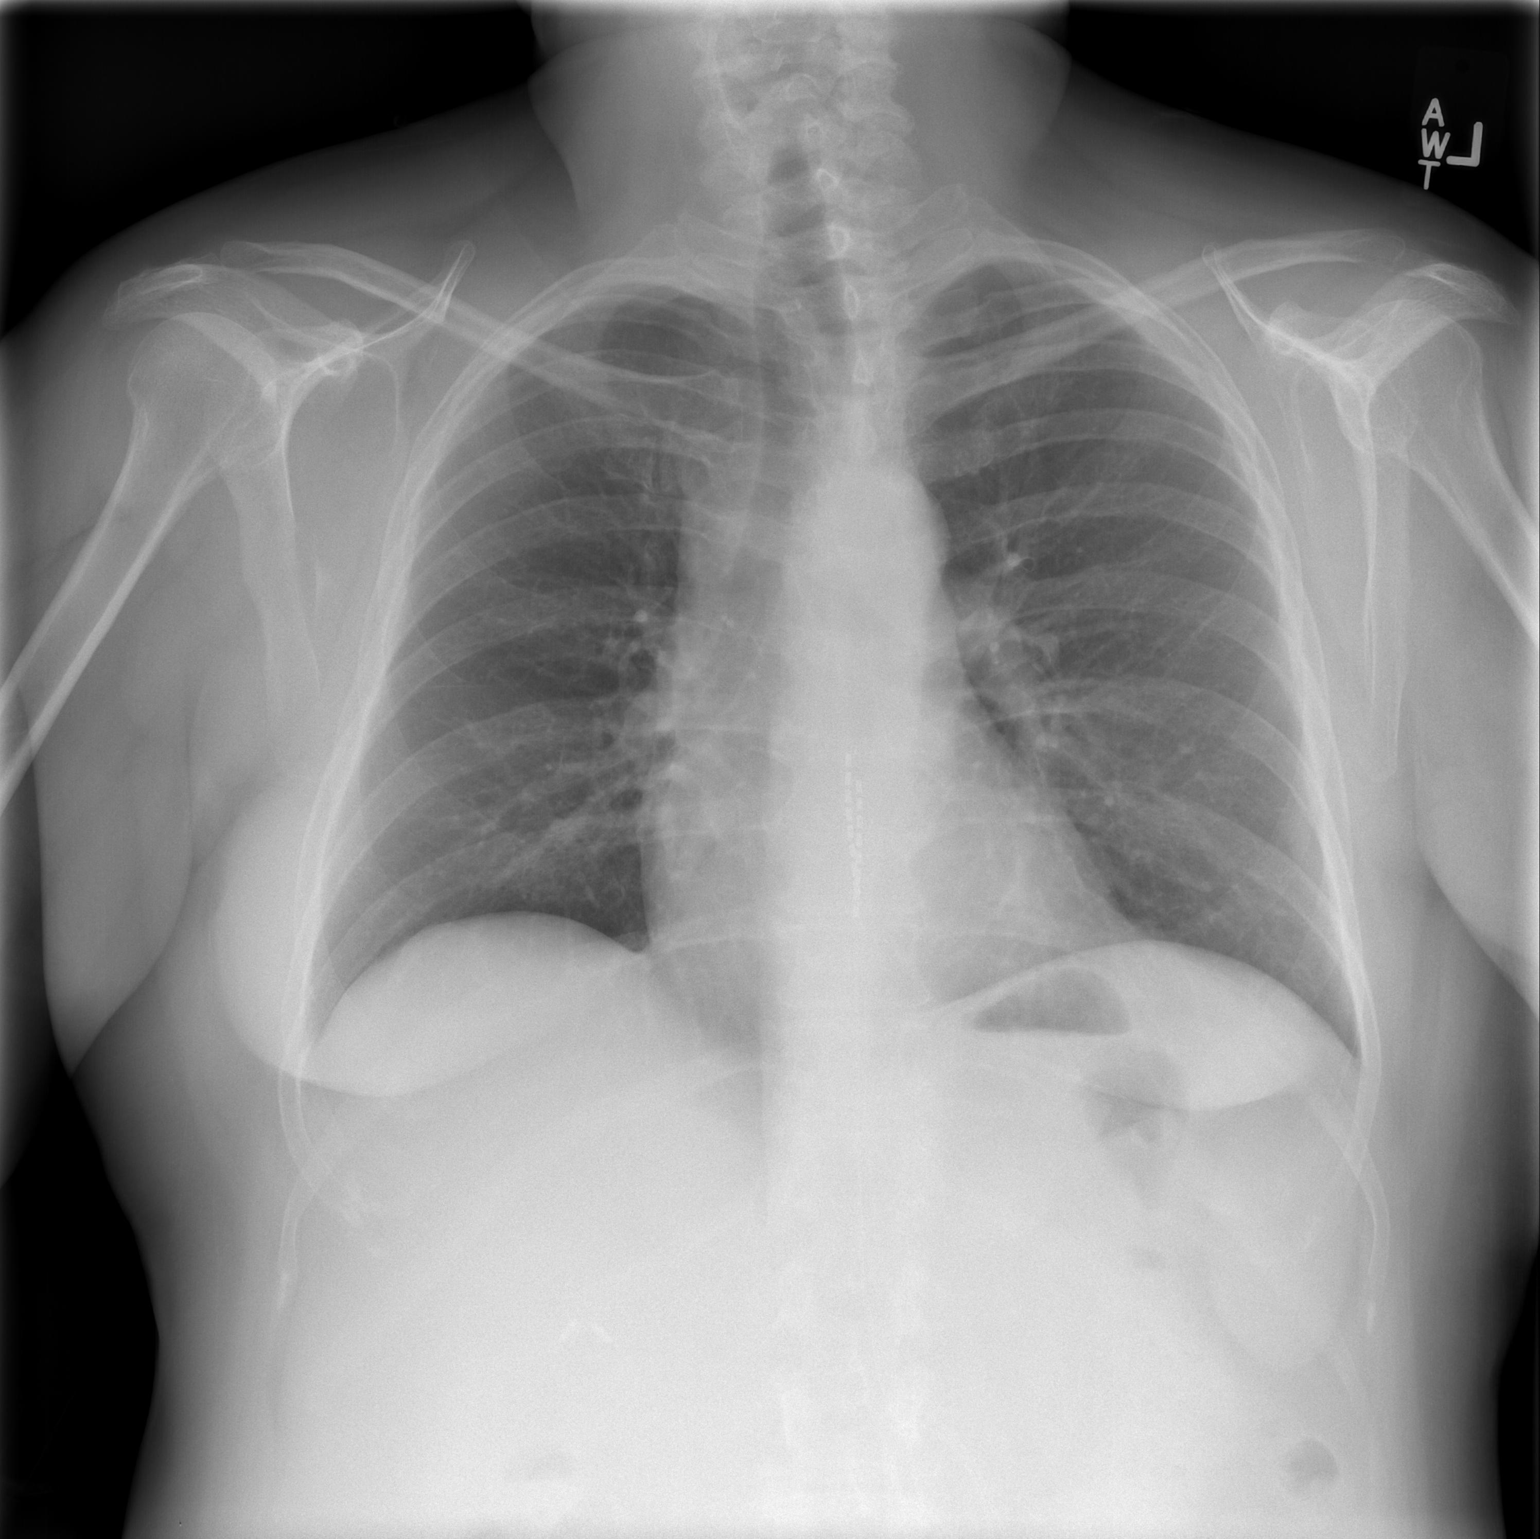

[w chest lat]
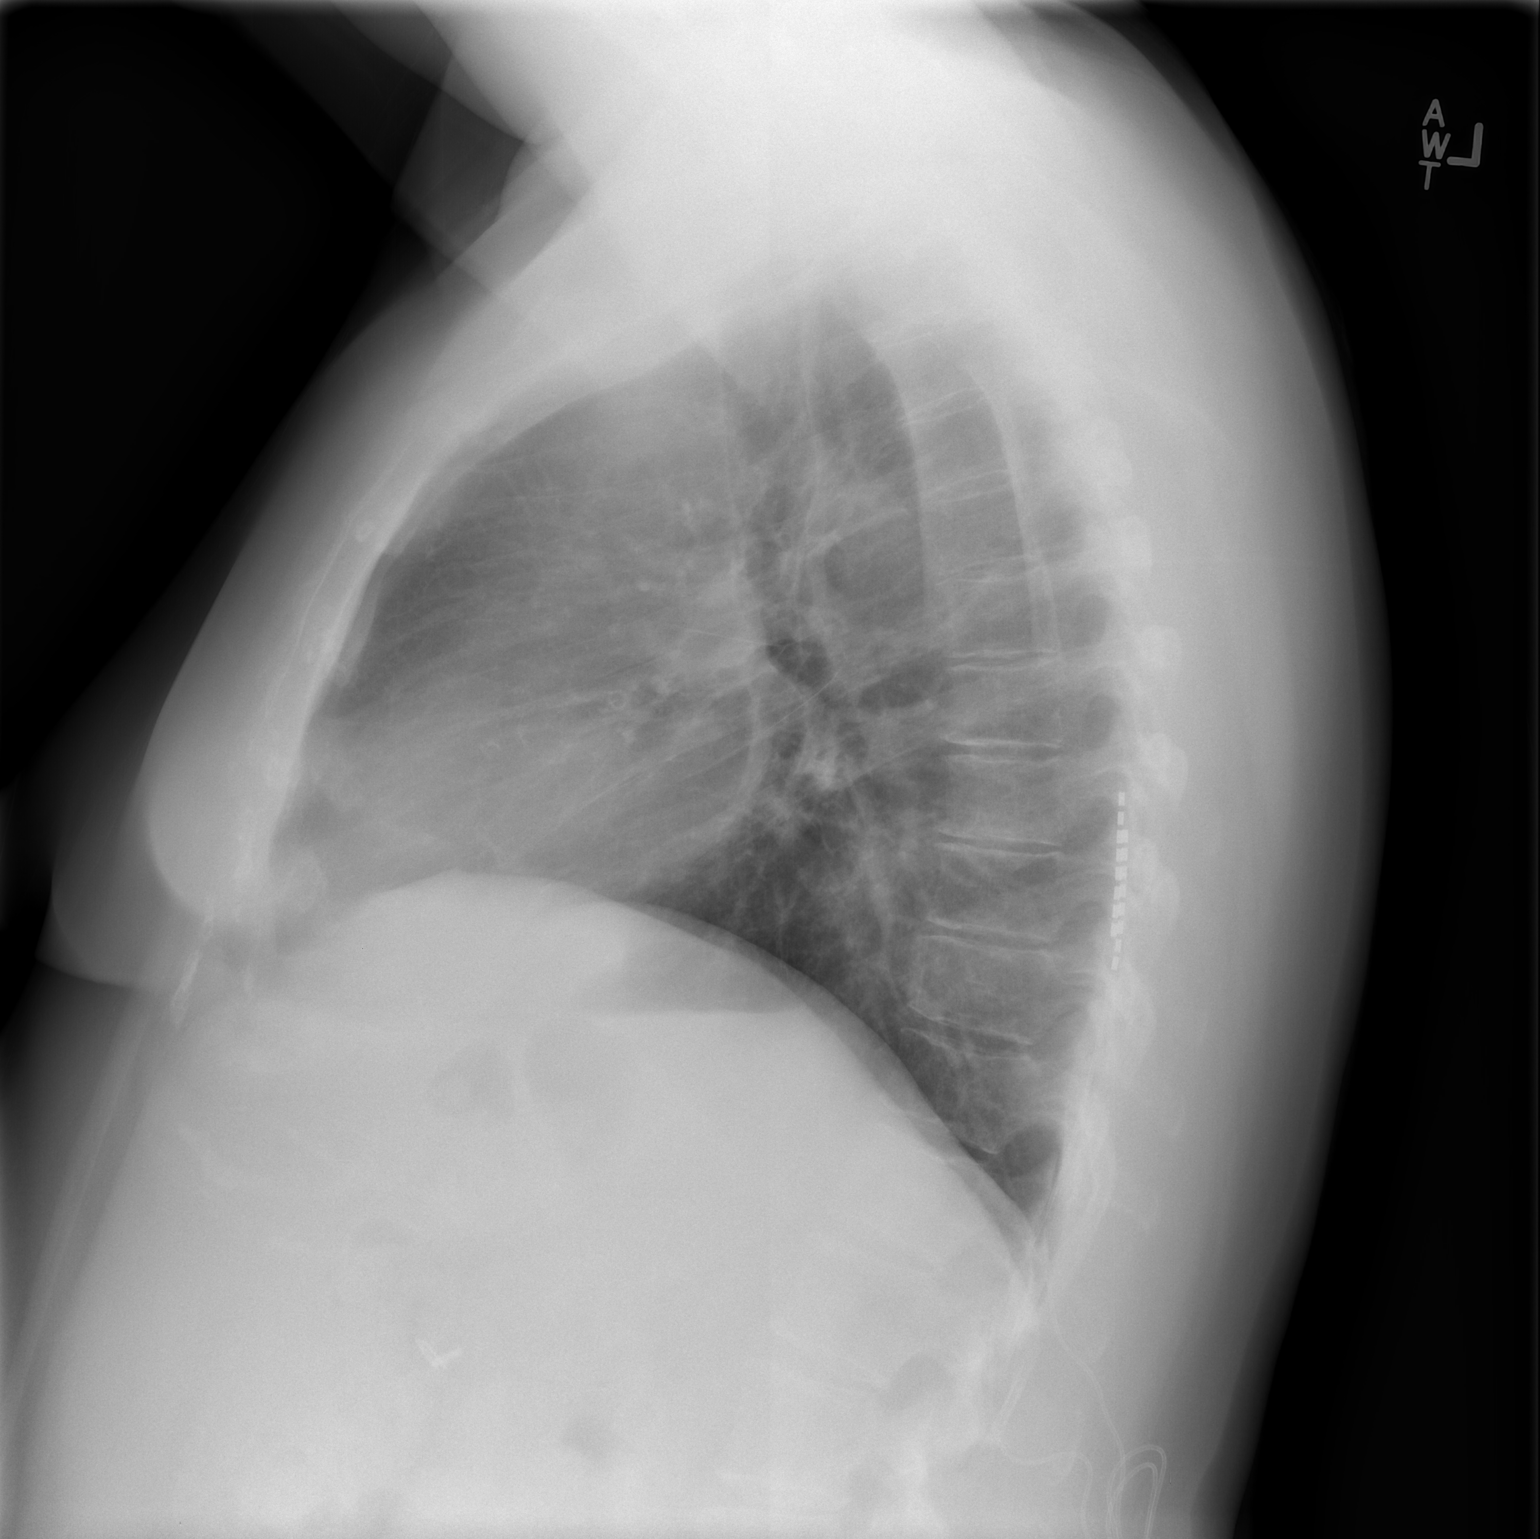

[2 of 2 positions shown; findings below may reference images not displayed]

FINDINGS: The heart size and mediastinal contours are within normal limits.
Both lungs are clear. The visualized skeletal structures are
unremarkable.
IMPRESSION: No active cardiopulmonary disease.

## 2014-10-11 IMAGING — DX DG PORTABLE PELVIS
1 series · 1 of 1 positions shown · non-contrast
Comparison: 05/04/2013

CLINICAL DATA: Postop hip

EXAM:
PORTABLE PELVIS 1-2 VIEWS

[pelvis ap]
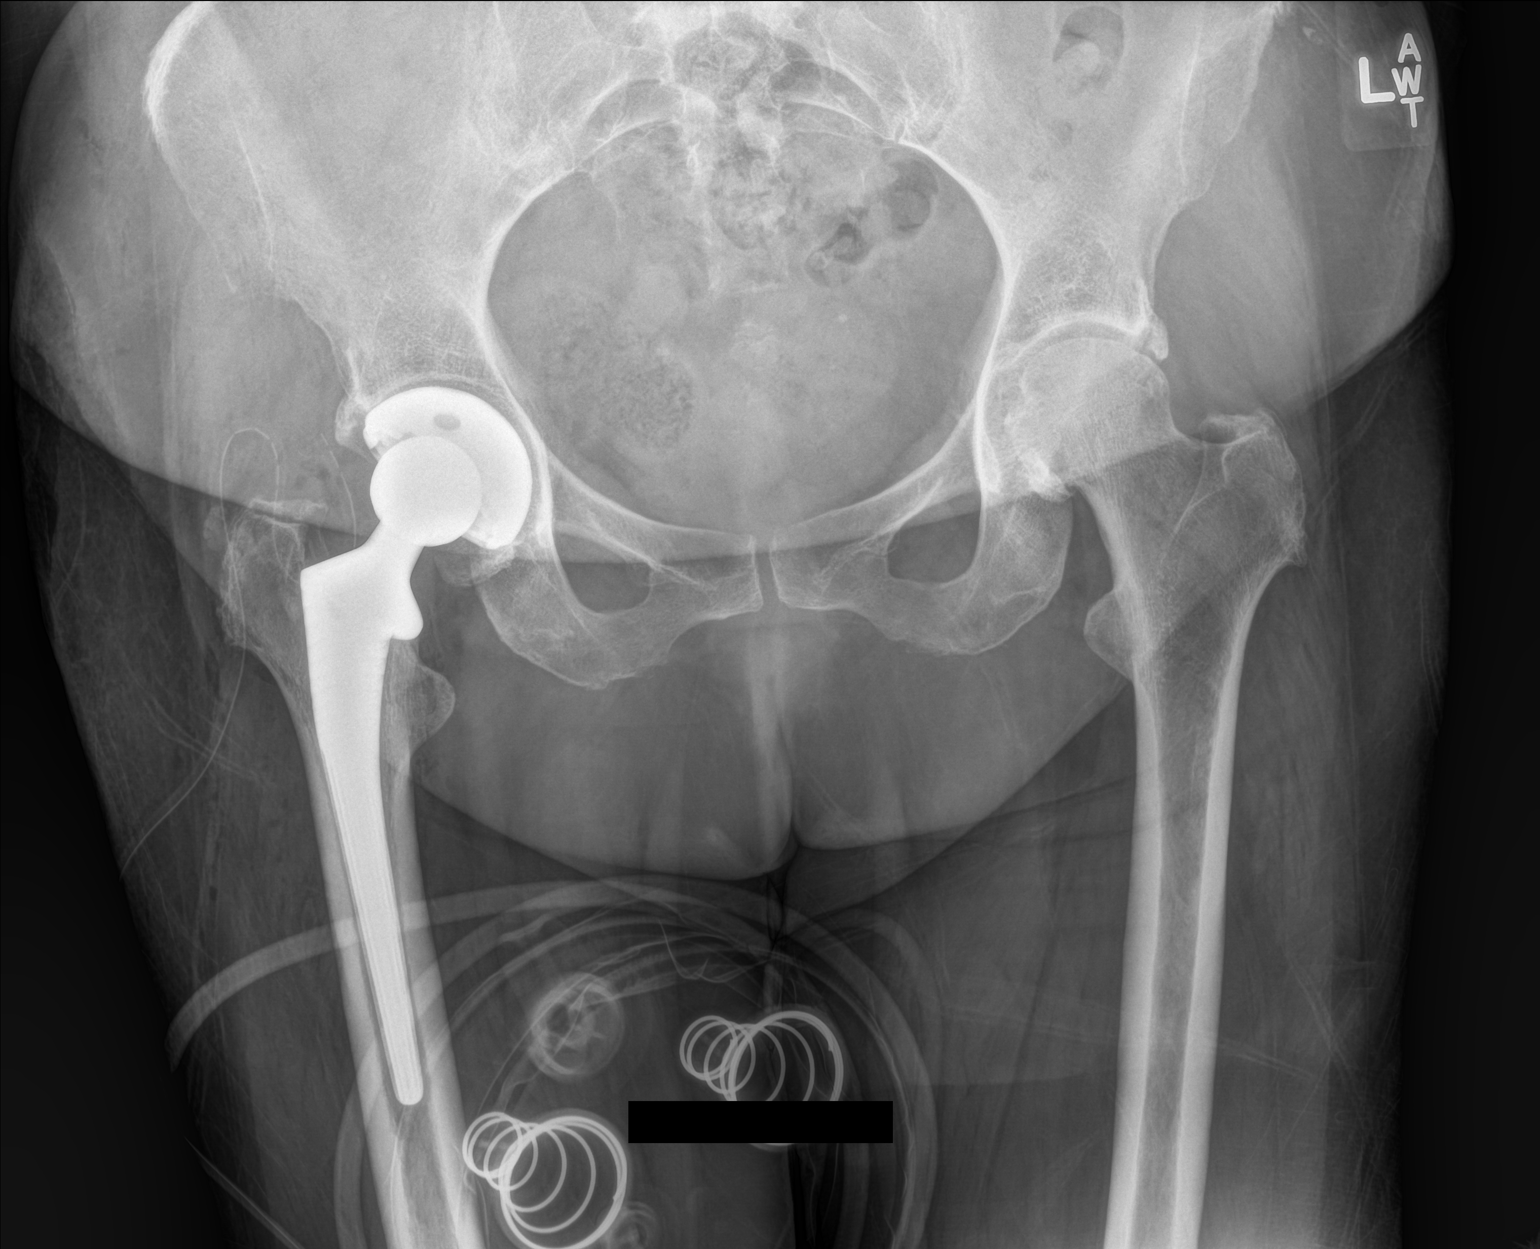

[1 of 1 positions shown; findings below may reference images not displayed]

FINDINGS: Right total hip arthroplasty has been placed. Anatomic alignment of
the osseous and prosthetic structures. No breakage or loosening of
the hardware. Moderate degenerative change of the left hip joint.
IMPRESSION: Right total hip arthroplasty anatomically aligned.

## 2014-10-24 ENCOUNTER — Other Ambulatory Visit (HOSPITAL_COMMUNITY): Payer: Self-pay | Admitting: Nephrology

## 2014-10-24 DIAGNOSIS — N186 End stage renal disease: Secondary | ICD-10-CM

## 2014-10-24 DIAGNOSIS — Z992 Dependence on renal dialysis: Principal | ICD-10-CM

## 2015-02-17 ENCOUNTER — Ambulatory Visit: Payer: No Typology Code available for payment source | Admitting: Endocrinology

## 2024-05-04 ENCOUNTER — Other Ambulatory Visit: Payer: Self-pay | Admitting: Physical Medicine and Rehabilitation

## 2024-05-04 DIAGNOSIS — M5416 Radiculopathy, lumbar region: Secondary | ICD-10-CM

## 2024-05-09 NOTE — Discharge Instructions (Signed)

## 2024-05-10 ENCOUNTER — Ambulatory Visit
Admission: RE | Admit: 2024-05-10 | Discharge: 2024-05-10 | Disposition: A | Discharge status: Home / Self Care | Source: Ambulatory Visit | Attending: Physical Medicine and Rehabilitation | Admitting: Physical Medicine and Rehabilitation

## 2024-05-10 DIAGNOSIS — M5416 Radiculopathy, lumbar region: Secondary | ICD-10-CM

## 2024-05-10 MED ORDER — ONDANSETRON HCL 4 MG/2ML IJ SOLN: 4.0000 mg | Freq: Once | INTRAMUSCULAR | Status: DC | PRN

## 2024-05-10 MED ORDER — MEPERIDINE HCL 50 MG/ML IJ SOLN: 50.0000 mg | Freq: Once | INTRAMUSCULAR | Status: DC | PRN

## 2024-05-10 MED ORDER — DIAZEPAM 5 MG PO TABS
5.0000 mg | ORAL_TABLET | Freq: Once | ORAL | Status: AC
  Administered 2024-05-10: 5 mg via ORAL

## 2024-05-10 MED ORDER — IOPAMIDOL (ISOVUE-M 200) INJECTION 41%
18.0000 mL | Freq: Once | INTRAMUSCULAR | Status: AC
  Administered 2024-05-10: 18 mL via INTRATHECAL

## 2024-10-25 ENCOUNTER — Encounter: Payer: Self-pay | Admitting: Orthopedic Surgery

## 2024-10-25 ENCOUNTER — Ambulatory Visit
Admission: RE | Admit: 2024-10-25 | Discharge: 2024-10-25 | Disposition: A | Discharge status: Home / Self Care | Source: Ambulatory Visit | Attending: Orthopedic Surgery | Admitting: Orthopedic Surgery

## 2024-10-25 ENCOUNTER — Other Ambulatory Visit: Payer: Self-pay | Admitting: Orthopedic Surgery

## 2024-10-25 DIAGNOSIS — M25552 Pain in left hip: Secondary | ICD-10-CM
